# Patient Record
Sex: Female | Born: 1935 | Race: White | Hispanic: No | Smoking: Never smoker
Health system: Southern US, Community
[De-identification: ages and names within clinical notes are randomized; demographics above are authoritative.]

## PROBLEM LIST (undated history)

## (undated) DIAGNOSIS — F0390 Unspecified dementia without behavioral disturbance: Secondary | ICD-10-CM

## (undated) DIAGNOSIS — F329 Major depressive disorder, single episode, unspecified: Secondary | ICD-10-CM

## (undated) DIAGNOSIS — S42309A Unspecified fracture of shaft of humerus, unspecified arm, initial encounter for closed fracture: Secondary | ICD-10-CM

## (undated) DIAGNOSIS — M810 Age-related osteoporosis without current pathological fracture: Secondary | ICD-10-CM

## (undated) DIAGNOSIS — I671 Cerebral aneurysm, nonruptured: Secondary | ICD-10-CM

## (undated) DIAGNOSIS — F32A Depression, unspecified: Secondary | ICD-10-CM

## (undated) DIAGNOSIS — K5900 Constipation, unspecified: Secondary | ICD-10-CM

## (undated) DIAGNOSIS — I10 Essential (primary) hypertension: Secondary | ICD-10-CM

## (undated) DIAGNOSIS — R296 Repeated falls: Secondary | ICD-10-CM

## (undated) DIAGNOSIS — N189 Chronic kidney disease, unspecified: Secondary | ICD-10-CM

## (undated) HISTORY — DX: Depression, unspecified: F32.A

## (undated) HISTORY — DX: Unspecified fracture of shaft of humerus, unspecified arm, initial encounter for closed fracture: S42.309A

## (undated) HISTORY — DX: Major depressive disorder, single episode, unspecified: F32.9

---

## 1998-11-27 DIAGNOSIS — S42309A Unspecified fracture of shaft of humerus, unspecified arm, initial encounter for closed fracture: Secondary | ICD-10-CM

## 1998-11-27 HISTORY — DX: Unspecified fracture of shaft of humerus, unspecified arm, initial encounter for closed fracture: S42.309A

## 1999-02-25 ENCOUNTER — Emergency Department (HOSPITAL_COMMUNITY): Admission: EM | Admit: 1999-02-25 | Discharge: 1999-02-25 | Payer: Self-pay | Admitting: Emergency Medicine

## 1999-06-23 ENCOUNTER — Encounter: Payer: Self-pay | Admitting: Emergency Medicine

## 1999-06-23 ENCOUNTER — Emergency Department (HOSPITAL_COMMUNITY): Admission: EM | Admit: 1999-06-23 | Discharge: 1999-06-23 | Payer: Self-pay | Admitting: Emergency Medicine

## 2003-04-28 ENCOUNTER — Encounter (INDEPENDENT_AMBULATORY_CARE_PROVIDER_SITE_OTHER): Payer: Self-pay | Admitting: *Deleted

## 2003-04-28 LAB — CONVERTED CEMR LAB

## 2003-05-21 ENCOUNTER — Encounter: Admission: RE | Admit: 2003-05-21 | Discharge: 2003-05-21 | Payer: Self-pay | Admitting: Family Medicine

## 2003-05-21 ENCOUNTER — Other Ambulatory Visit: Admission: RE | Admit: 2003-05-21 | Discharge: 2003-05-21 | Payer: Self-pay | Admitting: Family Medicine

## 2003-06-08 ENCOUNTER — Encounter: Payer: Self-pay | Admitting: Family Medicine

## 2003-06-08 ENCOUNTER — Encounter: Admission: RE | Admit: 2003-06-08 | Discharge: 2003-06-08 | Payer: Self-pay | Admitting: Family Medicine

## 2005-08-23 ENCOUNTER — Ambulatory Visit: Payer: Self-pay | Admitting: Family Medicine

## 2005-08-23 ENCOUNTER — Encounter: Admission: RE | Admit: 2005-08-23 | Discharge: 2005-08-23 | Payer: Self-pay | Admitting: Sports Medicine

## 2005-09-20 ENCOUNTER — Ambulatory Visit: Payer: Self-pay | Admitting: Family Medicine

## 2007-01-24 DIAGNOSIS — J309 Allergic rhinitis, unspecified: Secondary | ICD-10-CM | POA: Insufficient documentation

## 2007-01-24 DIAGNOSIS — M810 Age-related osteoporosis without current pathological fracture: Secondary | ICD-10-CM | POA: Insufficient documentation

## 2007-01-24 DIAGNOSIS — M1990 Unspecified osteoarthritis, unspecified site: Secondary | ICD-10-CM

## 2007-01-25 ENCOUNTER — Encounter (INDEPENDENT_AMBULATORY_CARE_PROVIDER_SITE_OTHER): Payer: Self-pay | Admitting: *Deleted

## 2007-08-15 ENCOUNTER — Encounter (INDEPENDENT_AMBULATORY_CARE_PROVIDER_SITE_OTHER): Payer: Self-pay | Admitting: Family Medicine

## 2008-02-23 ENCOUNTER — Emergency Department (HOSPITAL_COMMUNITY): Admission: EM | Admit: 2008-02-23 | Discharge: 2008-02-23 | Payer: Self-pay | Admitting: Emergency Medicine

## 2008-02-28 ENCOUNTER — Ambulatory Visit: Payer: Self-pay | Admitting: Family Medicine

## 2008-04-01 ENCOUNTER — Ambulatory Visit: Payer: Self-pay | Admitting: Family Medicine

## 2008-04-01 DIAGNOSIS — T8040XA Rh incompatibility reaction due to transfusion of blood or blood products, unspecified, initial encounter: Secondary | ICD-10-CM | POA: Insufficient documentation

## 2008-04-01 DIAGNOSIS — I10 Essential (primary) hypertension: Secondary | ICD-10-CM | POA: Insufficient documentation

## 2008-04-08 ENCOUNTER — Encounter (INDEPENDENT_AMBULATORY_CARE_PROVIDER_SITE_OTHER): Payer: Self-pay | Admitting: Family Medicine

## 2008-04-08 ENCOUNTER — Ambulatory Visit: Payer: Self-pay | Admitting: Family Medicine

## 2008-04-08 LAB — CONVERTED CEMR LAB

## 2008-04-09 LAB — CONVERTED CEMR LAB
ALT: 26 units/L (ref 0–35)
AST: 22 units/L (ref 0–37)
Albumin: 4.9 g/dL (ref 3.5–5.2)
Calcium: 9.6 mg/dL (ref 8.4–10.5)
Chloride: 107 meq/L (ref 96–112)
Cholesterol, target level: 200 mg/dL
HDL goal, serum: 40 mg/dL
Platelets: 262 10*3/uL (ref 150–400)
Potassium: 4.7 meq/L (ref 3.5–5.3)
RDW: 12.9 % (ref 11.5–15.5)
Sodium: 143 meq/L (ref 135–145)
TSH: 2.214 microintl units/mL (ref 0.350–5.50)
Total CHOL/HDL Ratio: 4.3
Total Protein: 7.3 g/dL (ref 6.0–8.3)

## 2008-04-23 ENCOUNTER — Encounter: Admission: RE | Admit: 2008-04-23 | Discharge: 2008-04-23 | Payer: Self-pay | Admitting: Family Medicine

## 2008-04-25 ENCOUNTER — Emergency Department (HOSPITAL_COMMUNITY): Admission: EM | Admit: 2008-04-25 | Discharge: 2008-04-25 | Payer: Self-pay | Admitting: Emergency Medicine

## 2008-04-27 ENCOUNTER — Ambulatory Visit: Payer: Self-pay | Admitting: Gastroenterology

## 2008-04-28 ENCOUNTER — Ambulatory Visit: Payer: Self-pay | Admitting: Family Medicine

## 2008-04-28 ENCOUNTER — Encounter (INDEPENDENT_AMBULATORY_CARE_PROVIDER_SITE_OTHER): Payer: Self-pay | Admitting: Family Medicine

## 2008-05-05 ENCOUNTER — Ambulatory Visit: Payer: Self-pay | Admitting: Family Medicine

## 2008-05-11 ENCOUNTER — Ambulatory Visit: Payer: Self-pay | Admitting: Gastroenterology

## 2008-07-28 ENCOUNTER — Ambulatory Visit: Payer: Self-pay | Admitting: Sports Medicine

## 2008-07-28 ENCOUNTER — Telehealth: Payer: Self-pay | Admitting: *Deleted

## 2008-08-17 ENCOUNTER — Ambulatory Visit: Payer: Self-pay | Admitting: Family Medicine

## 2008-08-17 DIAGNOSIS — R413 Other amnesia: Secondary | ICD-10-CM

## 2008-08-24 ENCOUNTER — Encounter: Payer: Self-pay | Admitting: Family Medicine

## 2008-08-25 ENCOUNTER — Encounter: Payer: Self-pay | Admitting: *Deleted

## 2008-10-23 ENCOUNTER — Emergency Department (HOSPITAL_COMMUNITY): Admission: EM | Admit: 2008-10-23 | Discharge: 2008-10-23 | Payer: Self-pay | Admitting: Emergency Medicine

## 2009-01-08 ENCOUNTER — Ambulatory Visit: Payer: Self-pay | Admitting: Family Medicine

## 2009-01-22 ENCOUNTER — Ambulatory Visit: Payer: Self-pay | Admitting: Family Medicine

## 2009-02-15 ENCOUNTER — Ambulatory Visit: Payer: Self-pay | Admitting: Family Medicine

## 2009-04-07 ENCOUNTER — Ambulatory Visit: Payer: Self-pay | Admitting: Family Medicine

## 2009-04-07 ENCOUNTER — Encounter: Payer: Self-pay | Admitting: Family Medicine

## 2009-04-07 LAB — CONVERTED CEMR LAB

## 2009-04-08 ENCOUNTER — Encounter: Payer: Self-pay | Admitting: Family Medicine

## 2009-04-08 ENCOUNTER — Telehealth: Payer: Self-pay | Admitting: Family Medicine

## 2009-04-08 LAB — CONVERTED CEMR LAB
BUN: 39 mg/dL — ABNORMAL HIGH (ref 6–23)
Basophils Absolute: 0.1 10*3/uL (ref 0.0–0.1)
Basophils Relative: 1 % (ref 0–1)
Calcium: 10.3 mg/dL (ref 8.4–10.5)
Creatinine, Ser: 1.12 mg/dL (ref 0.40–1.20)
Glucose, Bld: 89 mg/dL (ref 70–99)
Hemoglobin: 12.1 g/dL (ref 12.0–15.0)
MCHC: 33.7 g/dL (ref 30.0–36.0)
Monocytes Absolute: 0.4 10*3/uL (ref 0.1–1.0)
Neutro Abs: 4.5 10*3/uL (ref 1.7–7.7)
RDW: 13.3 % (ref 11.5–15.5)
Sodium: 140 meq/L (ref 135–145)

## 2009-05-17 ENCOUNTER — Encounter: Payer: Self-pay | Admitting: Family Medicine

## 2009-05-17 ENCOUNTER — Ambulatory Visit: Payer: Self-pay | Admitting: Family Medicine

## 2009-06-16 ENCOUNTER — Ambulatory Visit: Payer: Self-pay | Admitting: Family Medicine

## 2009-07-07 ENCOUNTER — Encounter: Admission: RE | Admit: 2009-07-07 | Discharge: 2009-07-07 | Payer: Self-pay | Admitting: Family Medicine

## 2009-07-07 ENCOUNTER — Encounter: Payer: Self-pay | Admitting: Family Medicine

## 2009-07-08 ENCOUNTER — Ambulatory Visit: Payer: Self-pay | Admitting: Family Medicine

## 2009-07-13 ENCOUNTER — Encounter: Admission: RE | Admit: 2009-07-13 | Discharge: 2009-07-13 | Payer: Self-pay | Admitting: Family Medicine

## 2009-08-18 ENCOUNTER — Ambulatory Visit: Payer: Self-pay | Admitting: Family Medicine

## 2009-08-18 ENCOUNTER — Encounter: Payer: Self-pay | Admitting: *Deleted

## 2009-08-18 DIAGNOSIS — K5909 Other constipation: Secondary | ICD-10-CM | POA: Insufficient documentation

## 2009-08-18 DIAGNOSIS — H547 Unspecified visual loss: Secondary | ICD-10-CM | POA: Insufficient documentation

## 2009-11-03 ENCOUNTER — Encounter: Payer: Self-pay | Admitting: Family Medicine

## 2009-11-03 ENCOUNTER — Telehealth: Payer: Self-pay | Admitting: Family Medicine

## 2009-11-03 ENCOUNTER — Ambulatory Visit: Payer: Self-pay | Admitting: Family Medicine

## 2009-11-24 ENCOUNTER — Encounter: Payer: Self-pay | Admitting: Family Medicine

## 2010-03-04 ENCOUNTER — Encounter: Payer: Self-pay | Admitting: Family Medicine

## 2010-03-04 ENCOUNTER — Ambulatory Visit: Payer: Self-pay | Admitting: Family Medicine

## 2010-03-04 DIAGNOSIS — N183 Chronic kidney disease, stage 3 (moderate): Secondary | ICD-10-CM

## 2010-03-05 ENCOUNTER — Encounter: Payer: Self-pay | Admitting: Family Medicine

## 2010-03-05 LAB — CONVERTED CEMR LAB
Calcium: 9.4 mg/dL (ref 8.4–10.5)
Creatinine, Ser: 0.81 mg/dL (ref 0.40–1.20)
TSH: 4.183 microintl units/mL (ref 0.350–4.500)

## 2010-06-24 ENCOUNTER — Ambulatory Visit: Payer: Self-pay | Admitting: Family Medicine

## 2010-06-24 ENCOUNTER — Encounter: Payer: Self-pay | Admitting: Family Medicine

## 2010-06-24 LAB — CONVERTED CEMR LAB
Potassium: 4.3 meq/L (ref 3.5–5.3)
Sodium: 146 meq/L — ABNORMAL HIGH (ref 135–145)

## 2010-07-19 ENCOUNTER — Ambulatory Visit: Payer: Self-pay | Admitting: Family Medicine

## 2010-08-17 ENCOUNTER — Ambulatory Visit: Payer: Self-pay | Admitting: Family Medicine

## 2010-09-02 ENCOUNTER — Encounter: Payer: Self-pay | Admitting: Family Medicine

## 2010-10-26 ENCOUNTER — Encounter (INDEPENDENT_AMBULATORY_CARE_PROVIDER_SITE_OTHER): Payer: Self-pay | Admitting: *Deleted

## 2010-11-14 ENCOUNTER — Ambulatory Visit: Payer: Self-pay | Admitting: Family Medicine

## 2010-12-14 ENCOUNTER — Ambulatory Visit: Admission: RE | Admit: 2010-12-14 | Discharge: 2010-12-14 | Payer: Self-pay | Source: Home / Self Care

## 2010-12-19 ENCOUNTER — Encounter: Payer: Self-pay | Admitting: Family Medicine

## 2010-12-21 ENCOUNTER — Encounter (INDEPENDENT_AMBULATORY_CARE_PROVIDER_SITE_OTHER): Payer: Self-pay | Admitting: *Deleted

## 2010-12-27 NOTE — Assessment & Plan Note (Signed)
Summary: F/U/KH   Vital Signs:  Patient profile:   77 year old female Height:      59 inches Weight:      129 pounds BMI:     26.15 BSA:     1.53 Temp:     98.7 degrees F Pulse rate:   61 / minute BP sitting:   132 / 62  Vitals Entered By: Jone Baseman CMA (July 19, 2010 1:35 PM) CC: F/U BP Is Patient Diabetic? No Pain Assessment Patient in pain? no        Primary Care Provider:  Ellery Plunk MD  CC:  F/U BP.  History of Present Illness: f/u for BP but got confused and did nto stop her HCTZ.  still having some othrostatic hypotension symptoms.    Habits & Providers  Alcohol-Tobacco-Diet     Tobacco Status: never  Current Medications (verified): 1)  Calcium 600/vitamin D 600-400 Mg-Unit  Chew (Calcium Carbonate-Vitamin D) .Marland Kitchen.. 1 Tab By Mouth Bid 2)  Fluticasone Propionate 50 Mcg/act  Susp (Fluticasone Propionate) .... One Spray Into Each Nostril Daily 3)  Adult Aspirin Low Strength 81 Mg  Tbdp (Aspirin) .Marland Kitchen.. 1 Tab By Mouth Daily 4)  Naproxen 500 Mg Tabs (Naproxen) .Marland Kitchen.. 1 Tab By Mouth Two Times A Day 5)  Zantac 150 Mg Caps (Ranitidine Hcl) .Marland Kitchen.. 1 Cap By Mouth Two Times A Day While Taking Naproxen 6)  Miralax  Powd (Polyethylene Glycol 3350) .Marland Kitchen.. 1 Capful in 8 Oz Water Twice Daily For Constipation.  Disp Qs X1 Month.  Allergies (verified): 1)  ! * Tramadol  Review of Systems  The patient denies anorexia, fever, chest pain, and abdominal pain.    Physical Exam  General:  Well-developed,well-nourished,in no acute distress; alert,appropriate and cooperative throughout examination Lungs:  Normal respiratory effort, chest expands symmetrically. Lungs are clear to auscultation, no crackles or wheezes. Heart:  Normal rate and regular rhythm. S1 and S2 normal without gallop, murmur, click, rub or other extra sounds.   Impression & Recommendations:  Problem # 1:  HYPERTENSION, BENIGN (ICD-401.1) Assessment Unchanged still lower than it needs to be given that  she is symptomatic with standing.  stop HCTZ, rtc in one month for BP check The following medications were removed from the medication list:    Hydrochlorothiazide 25 Mg Tabs (Hydrochlorothiazide) .Marland Kitchen... Take 1/2 tab by mouth every day  Orders: [facility]- Est Level  3 (16109)  Complete Medication List: 1)  Calcium 600/vitamin D 600-400 Mg-unit Chew (Calcium carbonate-vitamin d) .Marland Kitchen.. 1 tab by mouth bid 2)  Fluticasone Propionate 50 Mcg/act Susp (Fluticasone propionate) .... One spray into each nostril daily 3)  Adult Aspirin Low Strength 81 Mg Tbdp (Aspirin) .Marland Kitchen.. 1 tab by mouth daily 4)  Naproxen 500 Mg Tabs (Naproxen) .Marland Kitchen.. 1 tab by mouth two times a day 5)  Zantac 150 Mg Caps (Ranitidine hcl) .Marland Kitchen.. 1 cap by mouth two times a day while taking naproxen 6)  Miralax Powd (Polyethylene glycol 3350) .Marland Kitchen.. 1 capful in 8 oz water twice daily for constipation.  disp qs x1 month.  Patient Instructions: 1)  Please come back in one month 2)  do not take the hydrochlorathiazide.  we will see how your blood pressure is without that medication

## 2010-12-27 NOTE — Assessment & Plan Note (Signed)
Summary: f/u eo   Vital Signs:  Patient profile:   77 year old female Height:      59 inches Weight:      129 pounds BMI:     26.15 Pulse rate:   69 / minute BP sitting:   144 / 55  (left arm) Cuff size:   regular  Vitals Entered By: Jimmy Footman, CMA (June 24, 2010 9:54 AM) CC: constipation, BP, health screening Is Patient Diabetic? No Pain Assessment Patient in pain? no        Primary Care Provider:  Ellery Plunk MD  CC:  constipation, BP, and health screening.  History of Present Illness: constipation- managed well with miralax. using exlax 1x/month.  no bleeding, weight loss, fevers, abd pain.  has one small BM daily.  eating fruits and veggies from frozen.  son helps prepare meals.  BP-  VS look stable today.  pt taking 12.5mg  HCTZ.  has some occasionally dizziness on standing.  continues to have some cramping in feet and legs.  health maintanence- pt still going for mammograms, does not want to get colonoscopy due to cost.  pt reports mother and sister with breast Ca but mother died of DM complications, not breast Ca.  Current Medications (verified): 1)  Calcium 600/vitamin D 600-400 Mg-Unit  Chew (Calcium Carbonate-Vitamin D) .Marland Kitchen.. 1 Tab By Mouth Bid 2)  Fluticasone Propionate 50 Mcg/act  Susp (Fluticasone Propionate) .... One Spray Into Each Nostril Daily 3)  Adult Aspirin Low Strength 81 Mg  Tbdp (Aspirin) .Marland Kitchen.. 1 Tab By Mouth Daily 4)  Naproxen 500 Mg Tabs (Naproxen) .Marland Kitchen.. 1 Tab By Mouth Two Times A Day 5)  Zantac 150 Mg Caps (Ranitidine Hcl) .Marland Kitchen.. 1 Cap By Mouth Two Times A Day While Taking Naproxen 6)  Miralax  Powd (Polyethylene Glycol 3350) .Marland Kitchen.. 1 Capful in 8 Oz Water Twice Daily For Constipation.  Disp Qs X1 Month. 7)  Hydrochlorothiazide 25 Mg Tabs (Hydrochlorothiazide) .... Take 1/2 Tab By Mouth Every Day  Allergies (verified): 1)  ! * Tramadol  Review of Systems  The patient denies anorexia, fever, weight gain, vision loss, hoarseness, chest pain, and  prolonged cough.    Physical Exam  General:  vs reviewed, BP stablealert, well-developed, and well-nourished.   Head:  Normocephalic and atraumatic without obvious abnormalities. No apparent alopecia or balding. Neck:  No deformities, masses, or tenderness noted. Lungs:  Normal respiratory effort, chest expands symmetrically. Lungs are clear to auscultation, no crackles or wheezes. Heart:  Normal rate and regular rhythm. S1 and S2 normal without gallop, murmur, click, rub or other extra sounds. Abdomen:  Bowel sounds positive,abdomen soft and non-tender without masses, organomegaly or hernias noted. Extremities:  No clubbing, cyanosis, edema, or deformity noted with normal full range of motion of all joints.     Impression & Recommendations:  Problem # 1:  HYPERTENSION, BENIGN (ICD-401.1) Assessment Unchanged given pt's age, will allow permissive HTN to 150s/90s to prevent her from having orthostatic spells.  asked her to hold HCTZ for 3-4 weeks and come back to see me.  will eval BP off medication and see if  could allow her to go off med for a while. Her updated medication list for this problem includes:    Hydrochlorothiazide 25 Mg Tabs (Hydrochlorothiazide) .Marland Kitchen... Take 1/2 tab by mouth every day  Orders: [facility]- Est Level  3 (04540)  Problem # 2:  RENAL INSUFFICIENCY (ICD-588.9) Assessment: Unchanged given her chronic naproxen use and hx of renal insufficiency, will  check Cr to make sure not rising on medications.  last Cr 0.8 Orders: Basic Met-[facility] ([id number]) [facility]- Est Level  3 (09811)  Problem # 3:  CONSTIPATION, CHRONIC (ICD-564.09) Assessment: Improved one BM per day.  no signs of red flags that would necessitate colonoscopy.  continue current management. Her updated medication list for this problem includes:    Miralax Powd (Polyethylene glycol 3350) .Marland Kitchen... 1 capful in 8 oz water twice daily for constipation.  disp qs x1 month.  Orders: [facility]- Est Level  3 (91478)  Problem  # 4:  screening studies Assessment: Unchanged discussed mammograms and colonoscopies with patient.  risk vs benefit.  though pt has hx of breast ca, at this age her family hx is unlikely to be strong player for breast ca.  discussed incidence of breast ca increasing with age, but these tumors more likely to be slow growing.  left decision to pt.    Complete Medication List: 1)  Calcium 600/vitamin D 600-400 Mg-unit Chew (Calcium carbonate-vitamin d) .Marland Kitchen.. 1 tab by mouth bid 2)  Fluticasone Propionate 50 Mcg/act Susp (Fluticasone propionate) .... One spray into each nostril daily 3)  Adult Aspirin Low Strength 81 Mg Tbdp (Aspirin) .Marland Kitchen.. 1 tab by mouth daily 4)  Naproxen 500 Mg Tabs (Naproxen) .Marland Kitchen.. 1 tab by mouth two times a day 5)  Zantac 150 Mg Caps (Ranitidine hcl) .Marland Kitchen.. 1 cap by mouth two times a day while taking naproxen 6)  Miralax Powd (Polyethylene glycol 3350) .Marland Kitchen.. 1 capful in 8 oz water twice daily for constipation.  disp qs x1 month. 7)  Hydrochlorothiazide 25 Mg Tabs (Hydrochlorothiazide) .... Take 1/2 tab by mouth every day  Patient Instructions: 1)  It was very nice to meet you today! 2)  You have been taking great care of yourself! 3)  The mammograms and the colonoscopy are up to you.  We have discussed some of the pluses and minuses and you can decide. 4)  For your dizziness, lets try stopping the HCTZ for a few weeks.  Come back and see me in 3-4 weeks.

## 2010-12-27 NOTE — Assessment & Plan Note (Signed)
Summary: f/u,df   Vital Signs:  Patient profile:   77 year old female Height:      59 inches Weight:      134.3 pounds BMI:     27.22 Temp:     97.6 degrees F oral Pulse rate:   69 / minute BP sitting:   140 / 68  (left arm) Cuff size:   regular  Vitals Entered By: Garen Grams LPN (March 04, 5008 1:39 PM)  Serial Vital Signs/Assessments:  Comments: 1:47 PM [place] BP: 140/68 By: Jone Baseman CMA   CC: f/u Is Patient Diabetic? No Pain Assessment Patient in pain? yes     Location: legs/back   Primary Care Provider:  Romero Belling MD  CC:  f/u.  History of Present Illness: 77 yo female here for:  HYPERTENSION, BENIGN (ICD-401.1) Denies dyspnea, chest pain, LE edema.  Taking HCTZ as prescribed.  RENAL INSUFFICIENCY (ICD-588.9) Last Cr 1.12 in 03/2009, indicates GFR 47.  VISUAL ACUITY, DECREASED (ICD-369.9) Has cataracts.  Does not drive, reads a lot with glasses.  CONSTIPATION, CHRONIC (ICD-564.09) Stooling once per week.  Denies hematochezia, dyschezia, melena.  Using Miralax once weekly.  Occasional stimulant laxative use.  Has never had a colonoscopy secondary to concern for cost.  Stool cards for screening have been negative.  OSTEOARTHRITIS, MULTI SITES (ICD-715.98) Stable on Naproxen.  Habits & Providers  Alcohol-Tobacco-Diet     Tobacco Status: never  Current Medications (verified): 1)  Calcium 600/vitamin D 600-400 Mg-Unit  Chew (Calcium Carbonate-Vitamin D) .Marland Kitchen.. 1 Tab By Mouth Bid 2)  Fluticasone Propionate 50 Mcg/act  Susp (Fluticasone Propionate) .... One Spray Into Each Nostril Daily 3)  Adult Aspirin Low Strength 81 Mg  Tbdp (Aspirin) .Marland Kitchen.. 1 Tab By Mouth Daily 4)  Naproxen 500 Mg Tabs (Naproxen) .Marland Kitchen.. 1 Tab By Mouth Two Times A Day 5)  Zantac 150 Mg Caps (Ranitidine Hcl) .Marland Kitchen.. 1 Cap By Mouth Two Times A Day While Taking Naproxen 6)  Miralax  Powd (Polyethylene Glycol 3350) .Marland Kitchen.. 1 Capful in 8 Oz Water Once Daily For Constipation.  Disp Qs X1  Month. 7)  Metamucil Smooth Texture 63 % Powd (Psyllium) .Marland Kitchen.. 1 Teaspoon in 8 Oz Water Two Times A Day For Constipation.  Disp Qs X1 Month. 8)  Hydrochlorothiazide 25 Mg Tabs (Hydrochlorothiazide) .... Take 1/2 Tab By Mouth Every Day  Allergies (verified): 1)  ! * Tramadol  Physical Exam  Additional Exam:  VITALS:  Reviewed, normal GEN: Alert & oriented, no acute distress NECK: Midline trachea, no masses/thyromegaly, no cervical lymphadenopathy CARDIO: Regular rate and rhythm, no murmurs/rubs/gallops, 2+ bilateral radial pulses RESP: Clear to auscultation, normal work of breathing, no retractions/accessory muscle use ABD: Normoactive bowel sounds, nontender, no masses/hepatosplenomegaly EXT: Nontender, no edema    Impression & Recommendations:  Problem # 1:  RENAL INSUFFICIENCY (ICD-588.9) Assessment Unchanged Would like to avoid NSAIDs but this is all that has given her relief for OA.  Recheck Cr today. Orders: Basic Met-[facility] ([id number]) [facility]- Est  Level 4 (69678)  Problem # 2:  HYPERTENSION, BENIGN (ICD-401.1) Assessment: Improved  Her updated medication list for this problem includes:    Hydrochlorothiazide 25 Mg Tabs (Hydrochlorothiazide) .Marland Kitchen... Take 1/2 tab by mouth every day  BP today: 140/68 Prior BP: 135/67 (11/03/2009)  Prior 10 Yr Risk Heart Disease: 17 % (04/09/2008)  Labs Reviewed: K+: 5.0 (04/07/2009) Creat: : 1.12 (04/07/2009)   Chol: 208 (04/08/2008)   HDL: 48 (04/08/2008)   LDL: 132 (04/08/2008)   TG: 141 (  04/08/2008)  Orders: [facility]- Est  Level 4 (98119)  Problem # 3:  CONSTIPATION, CHRONIC (ICD-564.09) Assessment: Deteriorated  Increase Miralax to two times a day.  Encourage patient to look into cost of screening colonoscopy. Her updated medication list for this problem includes:    Miralax Powd (Polyethylene glycol 3350) .Marland Kitchen... 1 capful in 8 oz water once daily for constipation.  disp qs x1 month.  Orders: [facility]- Est  Level 4 (99214)  Problem # 4:   OSTEOARTHRITIS, MULTI SITES (ICD-715.98) Assessment: Improved Leg and back pain are stable on Naproxen.  APAP provided no relief for her in the past.  Would like to avoid NSAIDs given renal insufficiency. Her updated medication list for this problem includes:    Adult Aspirin Low Strength 81 Mg Tbdp (Aspirin) .Marland Kitchen... 1 tab by mouth daily    Naproxen 500 Mg Tabs (Naproxen) .Marland Kitchen... 1 tab by mouth two times a day  Complete Medication List: 1)  Calcium 600/vitamin D 600-400 Mg-unit Chew (Calcium carbonate-vitamin d) .Marland Kitchen.. 1 tab by mouth bid 2)  Fluticasone Propionate 50 Mcg/act Susp (Fluticasone propionate) .... One spray into each nostril daily 3)  Adult Aspirin Low Strength 81 Mg Tbdp (Aspirin) .Marland Kitchen.. 1 tab by mouth daily 4)  Naproxen 500 Mg Tabs (Naproxen) .Marland Kitchen.. 1 tab by mouth two times a day 5)  Zantac 150 Mg Caps (Ranitidine hcl) .Marland Kitchen.. 1 cap by mouth two times a day while taking naproxen 6)  Miralax Powd (Polyethylene glycol 3350) .Marland Kitchen.. 1 capful in 8 oz water twice daily for constipation.  disp qs x1 month. 7)  Hydrochlorothiazide 25 Mg Tabs (Hydrochlorothiazide) .... Take 1/2 tab by mouth every day  Patient Instructions: 1)  I think it is important to look into a colonoscopy for you.  Please call your insurance company and ask them how much a screening colonoscopy would cost. 2)  In the meantime, start using Miralax two times a day. 3)  Please schedule a follow-up appointment in 3 months--please come sooner if you do not stool at least once every 3 days.  Prescriptions: MIRALAX  POWD (POLYETHYLENE GLYCOL 3350) 1 capful in 8 oz water twice daily for constipation.  Disp QS x1 month.  #1 x 3   Entered and Authorized by:   Romero Belling MD   Signed by:   Romero Belling MD on 03/04/2010   Method used:   Electronically to        [id number] (retail)       [address]       [place], [place]  [id number]       Ph: [phone]       Fax: [phone]   RxID:    [id number]   Appended Document: f/u,df    Clinical Lists Changes  Orders: Added new Test order of [facility] [id number]) - Signed

## 2010-12-27 NOTE — Assessment & Plan Note (Signed)
Summary: F/U/KH   Vital Signs:  Patient profile:   77 year old female Height:      59 inches Weight:      133.2 pounds BMI:     27.00 Temp:     97.9 degrees F oral Pulse rate:   69 / minute BP sitting:   166 / 70  (left arm) Cuff size:   regular  Vitals Entered By: Garen Grams LPN (August 17, 2010 9:57 AM) CC: f/u bp; meds Is Patient Diabetic? No Pain Assessment Patient in pain? no        Primary Care Provider:  Ellery Plunk MD  CC:  f/u bp; meds.  History of Present Illness: Stopped HCTZ.  thinks she is doing better with less dizziness.  very occasional orthostatic hypotension now.  "getting along better" still active in house work.  helped by her son.    Habits & Providers  Alcohol-Tobacco-Diet     Tobacco Status: never  Current Medications (verified): 1)  Calcium 600/vitamin D 600-400 Mg-Unit  Chew (Calcium Carbonate-Vitamin D) .Marland Kitchen.. 1 Tab By Mouth Bid 2)  Fluticasone Propionate 50 Mcg/act  Susp (Fluticasone Propionate) .... One Spray Into Each Nostril Daily 3)  Adult Aspirin Low Strength 81 Mg  Tbdp (Aspirin) .Marland Kitchen.. 1 Tab By Mouth Daily 4)  Naproxen 500 Mg Tabs (Naproxen) .Marland Kitchen.. 1 Tab By Mouth Two Times A Day 5)  Zantac 150 Mg Caps (Ranitidine Hcl) .Marland Kitchen.. 1 Cap By Mouth Two Times A Day While Taking Naproxen 6)  Miralax  Powd (Polyethylene Glycol 3350) .Marland Kitchen.. 1 Capful in 8 Oz Water Twice Daily For Constipation.  Disp Qs X1 Month.  Allergies (verified): 1)  ! * Tramadol  Review of Systems  The patient denies anorexia, fever, and weight loss.    Physical Exam  General:  VS reviewed.  alert, well-developed, well-nourished, and well-hydrated.   Head:  normocephalic and atraumatic.   Lungs:  Normal respiratory effort, chest expands symmetrically. Lungs are clear to auscultation, no crackles or wheezes. Heart:  Normal rate and regular rhythm. S1 and S2 normal without gallop, murmur, click, rub or other extra sounds. Abdomen:  Bowel sounds positive,abdomen soft and  non-tender without masses, organomegaly or hernias noted. Extremities:  trace edema BLE   Impression & Recommendations:  Problem # 1:  HYPERTENSION, BENIGN (ICD-401.1) Assessment Unchanged BP is up but need to balance that with her dizzy spells.  discussed with pt and we decided that she was doing better off of the medication.  allow some permissive HTN to avoid hypotension.  will check Cr at next visit.  RTC 3 months. Orders: [facility]- Est Level  3 ([address]109)  Complete Medication List: 1)  Calcium 600/vitamin D 600-400 Mg-unit Chew (Calcium carbonate-vitamin d) .Marland Kitchen.. 1 tab by mouth bid 2)  Fluticasone Propionate 50 Mcg/act Susp (Fluticasone propionate) .... One spray into each nostril daily 3)  Adult Aspirin Low Strength 81 Mg Tbdp (Aspirin) .Marland Kitchen.. 1 tab by mouth daily 4)  Naproxen 500 Mg Tabs (Naproxen) .Marland Kitchen.. 1 tab by mouth two times a day 5)  Zantac 150 Mg Caps (Ranitidine hcl) .Marland Kitchen.. 1 cap by mouth two times a day while taking naproxen 6)  Miralax Powd (Polyethylene glycol 3350) .Marland Kitchen.. 1 capful in 8 oz water twice daily for constipation.  disp qs x1 month.  Patient Instructions: 1)  COme back to see me in 3 months or earlier if you need.  2)  Get up from chairs slowly so that you don't fall

## 2010-12-27 NOTE — Letter (Signed)
Summary: Results Follow-up Letter  [facility]  [address]   [place], [place] [address]   Phone: [phone]  Fax: [phone]    03/05/2010  [address] [place], [place]  [address]  Dear [facility],   The following are the results of your recent test(s):  Kidney Function -- normal Blood Sugar -- normal Blood Electrolytes -- normal Thyroid Function -- normal  Sincerely, Madlyn Frankel. Constance Goltz, MD  Appended Document: Results Follow-up Letter mailed.

## 2010-12-27 NOTE — Miscellaneous (Signed)
Summary: medical record req  Clinical Lists Changes  Rec'd medical record request to go to [employer] picked up 07/22/10 [facility]  October 26, 2010 1:39 PM

## 2010-12-27 NOTE — Miscellaneous (Signed)
  Clinical Lists Changes  Problems: Changed problem from RENAL INSUFFICIENCY (ICD-588.9) to CHRONIC KIDNEY DISEASE STAGE III (MODERATE) (ICD-585.3) 

## 2010-12-29 NOTE — Assessment & Plan Note (Signed)
Summary: 3 mo f/u,df   Vital Signs:  Patient profile:   77 year old female Height:      59 inches Weight:      131.5 pounds BMI:     26.66 Temp:     98.1 degrees F oral Pulse rate:   77 / minute BP sitting:   178 / 76  (left arm) Cuff size:   regular  Vitals Entered By: Garen Grams LPN (November 14, 2010 9:43 AM) CC: f/u HTN, back pain Is Patient Diabetic? No Pain Assessment Patient in pain? no        Primary Care Yisel Megill:  Ellery Plunk MD  CC:  f/u HTN and back pain.  History of Present Illness: HTN- does not take BP at home.  previously had some dizzy spells associated with orthostatic hypotension.  these are less frequent now.  back pain- stiff in the AM.  thinks that she sleeps well on a good mattress.  does a lot of housework including vaccuuming. takes aleve for the soreness, usually feels better in one hour.  Habits & Providers  Alcohol-Tobacco-Diet     Tobacco Status: never  Current Medications (verified): 1)  Calcium 600/vitamin D 600-400 Mg-Unit  Chew (Calcium Carbonate-Vitamin D) .Marland Kitchen.. 1 Tab By Mouth Bid 2)  Fluticasone Propionate 50 Mcg/act  Susp (Fluticasone Propionate) .... One Spray Into Each Nostril Daily 3)  Adult Aspirin Low Strength 81 Mg  Tbdp (Aspirin) .Marland Kitchen.. 1 Tab By Mouth Daily 4)  Naproxen 500 Mg Tabs (Naproxen) .Marland Kitchen.. 1 Tab By Mouth Two Times A Day 5)  Zantac 150 Mg Caps (Ranitidine Hcl) .Marland Kitchen.. 1 Cap By Mouth Two Times A Day While Taking Naproxen 6)  Miralax  Powd (Polyethylene Glycol 3350) .Marland Kitchen.. 1 Capful in 8 Oz Water Twice Daily For Constipation.  Disp Qs X1 Month.  Allergies (verified): 1)  ! * Tramadol  Review of Systems  The patient denies anorexia, fever, weight loss, and dyspnea on exertion.    Physical Exam  General:  VSr echecked BP 160/78well-developed, well-nourished, and well-hydrated.   Head:  normocephalic and atraumatic.   Lungs:  Normal respiratory effort, chest expands symmetrically. Lungs are clear to auscultation, no  crackles or wheezes. Heart:  Normal rate and regular rhythm. S1 and S2 normal without gallop, murmur, click, rub or other extra sounds. Msk:  weakness in hip flexors on standing but can stand without using arms Neurologic:  alert & oriented X3 and cranial nerves II-XII intact.     Impression & Recommendations:  Problem # 1:  HYPERTENSION, BENIGN (ICD-401.1) Assessment Unchanged will allow greater freedom with BP given symptomatic Hyptenstion with standing.  continue to monitor. Orders: [facility]- Est Level  3 ([address])  Problem # 2:  OSTEOARTHRITIS, MULTI SITES (ICD-715.98) Assessment: Unchanged refill aleve, monitor for worsening.  pt did not think she wanted PT but consider in futre for strength and mobility Her updated medication list for this problem includes:    Adult Aspirin Low Strength 81 Mg Tbdp (Aspirin) .Marland Kitchen... 1 tab by mouth daily    Naproxen 500 Mg Tabs (Naproxen) .Marland Kitchen... 1 tab by mouth two times a day  Orders: [facility]- Est Level  3 ([address])  Complete Medication List: 1)  Calcium 600/vitamin D 600-400 Mg-unit Chew (Calcium carbonate-vitamin d) .Marland Kitchen.. 1 tab by mouth bid 2)  Fluticasone Propionate 50 Mcg/act Susp (Fluticasone propionate) .... One spray into each nostril daily 3)  Adult Aspirin Low Strength 81 Mg Tbdp (Aspirin) .Marland Kitchen.. 1 tab by mouth daily 4)  Naproxen  500 Mg Tabs (Naproxen) .Marland Kitchen.. 1 tab by mouth two times a day 5)  Zantac 150 Mg Caps (Ranitidine hcl) .Marland Kitchen.. 1 cap by mouth two times a day while taking naproxen 6)  Miralax Powd (Polyethylene glycol 3350) .Marland Kitchen.. 1 capful in 8 oz water twice daily for constipation.  disp qs x1 month.  Patient Instructions: 1)  Please come back mid-January to see how you are doing 2)  For your back- take the aleve twice a day. 3)  For your blood pressure, we will continue to monitor. Prescriptions: FLUTICASONE PROPIONATE 50 MCG/ACT  SUSP (FLUTICASONE PROPIONATE) One spray into each nostril daily  #1 x 11   Entered and Authorized by:   Ellery Plunk  MD   Signed by:   Ellery Plunk MD on 11/14/2010   Method used:   Electronically to        [facility] (retail)       [address]       [place], [place]  [id number]       Ph: [phone]       Fax: [phone]   RxID:   [id number] MIRALAX  POWD (POLYETHYLENE GLYCOL 3350) 1 capful in 8 oz water twice daily for constipation.  Disp QS x1 month.  #1 x 6   Entered and Authorized by:   Ellery Plunk MD   Signed by:   Ellery Plunk MD on 11/14/2010   Method used:   Electronically to        [facility] (retail)       [address]       [place], [place]  [id number]       Ph: [phone]       Fax: [phone]   RxID:   [id number] ZANTAC 150 MG CAPS (RANITIDINE HCL) 1 cap by mouth two times a day while taking naproxen  #60 Tablet x 5   Entered and Authorized by:   Ellery Plunk MD   Signed by:   Ellery Plunk MD on 11/14/2010   Method used:   Electronically to        [facility] (retail)       [address]       [place], [place]  [id number]       Ph: [phone]       Fax: [phone]   RxID:   [id number] NAPROXEN 500 MG TABS (NAPROXEN) 1 tab by mouth two times a day  #60 Tablet x 3   Entered and Authorized by:   Ellery Plunk MD   Signed by:   Ellery Plunk MD on 11/14/2010   Method used:   Electronically to        [facility] (retail)       [address]       [place], [place]  [id number]       Ph: [phone]       Fax: [phone]   RxID:   [id number]    Orders Added: 1)  [facility]- Est Level  3 [[address]]

## 2010-12-29 NOTE — Letter (Signed)
Summary: Generic Letter  [facility]  [address]   [place], [place] [address]   Phone: [phone]  Fax: [phone]    12/21/2010  [address] [place], [place]  [address]  Dear Ms. [facility],  We are happy to let you know that since you are covered under Medicare you are able to have a FREE visit at the [facility] to discuss your HEALTH. This is a new benefit for Medicare.  There will be no co-payment.  At this visit you will meet with Arlys John an expert in wellness and the health coach at our clinic.  At this visit we will discuss ways to keep you healthy and feeling well.  This visit will not replace your regular doctor visit and we cannot refill medications.     You will need to plan to be here at least one hour to talk about your medical history, your current status, review all of your medications, and discuss your future plans for your health.  This information will be entered into your record for your doctor to have and review.  If you are interested in staying healthy, this type of visit can help.  Please call the office at: [phone], to schedule a "Medicare Wellness Visit".  The day of the visit you should bring in all of your medications, including any vitamins, herbs, over the counter products you take.  Make a list of all the other doctors that you see, so we know who they are. If you have any other health documents please bring them.  We look forward to helping you stay healthy.  Sincerely,   [facility]  i[facility]

## 2010-12-29 NOTE — Assessment & Plan Note (Signed)
Summary: F/U  KH   Vital Signs:  Patient profile:   77 year old female Height:      59 inches Weight:      131 pounds BMI:     26.55 BSA:     1.54 Temp:     98.1 degrees F Pulse rate:   67 / minute BP sitting:   180 / 80  Vitals Entered By: Jone Baseman CMA (December 14, 2010 9:50 AM) CC: BP, arthritis, memory Is Patient Diabetic? No Pain Assessment Patient in pain? no        Primary Care Provider:  Ellery Plunk MD  CC:  BP, arthritis, and memory.  History of Present Illness: HTN- does not check at home.  has not had many dizzy spells, only when she gets up from a squatting position too fast.  arthritis- feeling better with less aching.  using aleve for pain relief as needed and has not needed it much  memory- admits to forgetting numbers or names.  independant in her ADLs and IADLs.  does not drive now x several years.  SOn helps her when she needs.  He lives with her.   Habits & Providers  Alcohol-Tobacco-Diet     Tobacco Status: never   Geriatric Assessment:  Activities of Daily Living:    Bathing-independent    Dressing-independent    Eating-independent    Toileting-independent    Transferring-independent    Continence-independent Overall Assessment: independent  Instrumental Activities of Daily Living:    Transportation-assisted    Meal/Food Preparation-independent    Shopping Errands-assisted    Housekeeping/Chores-independent    Money Management/Finances-independent    Medication Management-independent    Ability to Use Telephone-independent    Laundry-independent Overall Assessment: independent  Mental Status Exam: (value/max value)    Orientation to Time: 5/5    Orientation to Place: 5/5    Registration: 3/3    Attention/Calculation: 4/5    Recall: 0/3    Language-name 2 objects: 2/2    Language-repeat: 1/1    Language-follow 3-step command: 3/3    Language-read and follow direction: 1/1    Write a sentence: 1/1    Copy design:  0/1 MSE Total score: 25/30  Current Medications (verified): 1)  Calcium 600/vitamin D 600-400 Mg-Unit  Chew (Calcium Carbonate-Vitamin D) .Marland Kitchen.. 1 Tab By Mouth Bid 2)  Fluticasone Propionate 50 Mcg/act  Susp (Fluticasone Propionate) .... One Spray Into Each Nostril Daily 3)  Adult Aspirin Low Strength 81 Mg  Tbdp (Aspirin) .Marland Kitchen.. 1 Tab By Mouth Daily 4)  Naproxen 500 Mg Tabs (Naproxen) .Marland Kitchen.. 1 Tab By Mouth Two Times A Day 5)  Zantac 150 Mg Caps (Ranitidine Hcl) .Marland Kitchen.. 1 Cap By Mouth Two Times A Day While Taking Naproxen 6)  Miralax  Powd (Polyethylene Glycol 3350) .Marland Kitchen.. 1 Capful in 8 Oz Water Twice Daily For Constipation.  Disp Qs X1 Month.  Allergies (verified): 1)  ! * Tramadol  Review of Systems  The patient denies weight loss, hoarseness, and syncope.    Physical Exam  General:  Well-developed,well-nourished,in no acute distress; alert,appropriate and cooperative throughout examination Lungs:  Normal respiratory effort, chest expands symmetrically. Lungs are clear to auscultation, no crackles or wheezes. Heart:  Normal rate and regular rhythm. S1 and S2 normal without gallop, murmur, click, rub or other extra sounds. Abdomen:  Bowel sounds positive,abdomen soft and non-tender without masses, organomegaly or hernias noted. Neurologic:  some intention tremor evident with writing alert & oriented X3, cranial nerves II-XII intact, strength normal  in all extremities, sensation intact to light touch, and gait normal.     Impression & Recommendations:  Problem # 1:  HYPERTENSION, BENIGN (ICD-401.1) Assessment Deteriorated still high today.  decided to start 12.5 of HCTZ, follow up in 2 weeks to check for symptomatic hypotension.  do blood work then Orders: [facility]- Est  Level 4 ([address])  Problem # 2:  OSTEOARTHRITIS, MULTI SITES (ICD-715.98) Assessment: Improved using less aleve now.  will recheck cr at next visit Her updated medication list for this problem includes:    Adult Aspirin Low  Strength 81 Mg Tbdp (Aspirin) .Marland Kitchen... 1 tab by mouth daily    Naproxen 500 Mg Tabs (Naproxen) .Marland Kitchen... 1 tab by mouth two times a day  Orders: [facility]- Est  Level 4 ([address])  Problem # 3:  MEMORY LOSS (ICD-780.93) Assessment: Deteriorated MMSE 25 today.  last check in 2009 was 30.  consider starting aricept at next visit.  still highly functional. Orders: [facility]- Est  Level 4 ([address])  Complete Medication List: 1)  Calcium 600/vitamin D 600-400 Mg-unit Chew (Calcium carbonate-vitamin d) .Marland Kitchen.. 1 tab by mouth bid 2)  Fluticasone Propionate 50 Mcg/act Susp (Fluticasone propionate) .... One spray into each nostril daily 3)  Adult Aspirin Low Strength 81 Mg Tbdp (Aspirin) .Marland Kitchen.. 1 tab by mouth daily 4)  Naproxen 500 Mg Tabs (Naproxen) .Marland Kitchen.. 1 tab by mouth two times a day 5)  Zantac 150 Mg Caps (Ranitidine hcl) .Marland Kitchen.. 1 cap by mouth two times a day while taking naproxen 6)  Miralax Powd (Polyethylene glycol 3350) .Marland Kitchen.. 1 capful in 8 oz water twice daily for constipation.  disp qs x1 month.  Patient Instructions: 1)  start taking 12.5 mg of HCTZ (1/2 of a 25 tablet) 2)  IF you dont have that at home, call me and I will send it to the pharmacy 3)  come back in 2 weeks for a check up and blood work.  4)  If you start feeling bad, call me sooner   Orders Added: 1)  [facility]- Est  Level 4 [[address]]

## 2011-01-25 ENCOUNTER — Ambulatory Visit (INDEPENDENT_AMBULATORY_CARE_PROVIDER_SITE_OTHER): Payer: PRIVATE HEALTH INSURANCE | Admitting: Family Medicine

## 2011-01-25 ENCOUNTER — Encounter: Payer: Self-pay | Admitting: Family Medicine

## 2011-01-25 DIAGNOSIS — M1990 Unspecified osteoarthritis, unspecified site: Secondary | ICD-10-CM

## 2011-01-25 DIAGNOSIS — I10 Essential (primary) hypertension: Secondary | ICD-10-CM

## 2011-01-25 DIAGNOSIS — J069 Acute upper respiratory infection, unspecified: Secondary | ICD-10-CM

## 2011-01-25 MED ORDER — POLYETHYLENE GLYCOL 3350 17 GM/SCOOP PO POWD: 17.0000 g | Freq: Two times a day (BID) | ORAL | Status: DC

## 2011-01-25 MED ORDER — NAPROXEN 500 MG PO TABS: 500.0000 mg | ORAL_TABLET | Freq: Two times a day (BID) | ORAL | Status: DC

## 2011-01-25 NOTE — Assessment & Plan Note (Signed)
Overall improving.  No fevers.  No signs of serious infection.  Supportive care.

## 2011-01-25 NOTE — Assessment & Plan Note (Signed)
Continue Naproxen prn

## 2011-01-25 NOTE — Assessment & Plan Note (Signed)
Not at goal.  Advised patient to return in 3-4 weeks for a blood pressure recheck.  If still elevated would consider starting Norvasc.

## 2011-01-25 NOTE — Progress Notes (Signed)
  Subjective:    Patient ID: Nancy Park, female    DOB: [birth year], 77 y.o.   MRN: [id number]  URI  This is a new problem. The current episode started 1 to 4 weeks ago (over the past week it has been getting a lot better). The problem has been gradually improving. There has been no fever. Associated symptoms include congestion and coughing. Pertinent negatives include no abdominal pain, chest pain, diarrhea, headaches, nausea, swollen glands or vomiting. She has tried nothing for the symptoms.  Hypertension This is a chronic problem. The problem has been gradually worsening since onset. Pertinent negatives include no chest pain, headaches, palpitations or shortness of breath. Past treatments include nothing. There are no compliance problems.  There is no history of angina.      Review of Systems  HENT: Positive for congestion.   Respiratory: Positive for cough. Negative for shortness of breath.   Cardiovascular: Negative for chest pain and palpitations.  Gastrointestinal: Negative for nausea, vomiting, abdominal pain and diarrhea.  Neurological: Negative for headaches.       Objective:   Physical Exam  Constitutional: She appears well-developed and well-nourished.  Eyes: Conjunctivae are normal. Pupils are equal, round, and reactive to light.  Neck: Normal range of motion. Neck supple.  Cardiovascular: Normal rate and regular rhythm.   Murmur heard. Pulmonary/Chest: Effort normal and breath sounds normal. No respiratory distress. She has no wheezes. She has no rales.  Abdominal: Soft. She exhibits no distension.  Skin: Skin is warm and dry.          Assessment & Plan:

## 2011-02-20 ENCOUNTER — Encounter: Payer: Self-pay | Admitting: Family Medicine

## 2011-02-20 ENCOUNTER — Ambulatory Visit (INDEPENDENT_AMBULATORY_CARE_PROVIDER_SITE_OTHER): Payer: PRIVATE HEALTH INSURANCE | Admitting: Family Medicine

## 2011-02-20 VITALS — BP 179/62 | HR 64 | Temp 97.9°F | Wt 130.8 lb

## 2011-02-20 DIAGNOSIS — J309 Allergic rhinitis, unspecified: Secondary | ICD-10-CM

## 2011-02-20 DIAGNOSIS — R011 Cardiac murmur, unspecified: Secondary | ICD-10-CM | POA: Insufficient documentation

## 2011-02-20 DIAGNOSIS — K5909 Other constipation: Secondary | ICD-10-CM

## 2011-02-20 DIAGNOSIS — I10 Essential (primary) hypertension: Secondary | ICD-10-CM

## 2011-02-20 DIAGNOSIS — M1990 Unspecified osteoarthritis, unspecified site: Secondary | ICD-10-CM

## 2011-02-20 MED ORDER — LISINOPRIL 5 MG PO TABS: 5.0000 mg | ORAL_TABLET | Freq: Every day | ORAL | Status: DC

## 2011-02-20 MED ORDER — POLYETHYLENE GLYCOL 3350 17 GM/SCOOP PO POWD: 17.0000 g | Freq: Four times a day (QID) | ORAL | Status: DC

## 2011-02-20 NOTE — Patient Instructions (Signed)
It was nice to see you today Please STOP taking Iron Go get your heart study done START taking lisinopril for blood pressure Come back in 2 weeks for a nurse visit and more blood work See me in one month

## 2011-02-20 NOTE — Assessment & Plan Note (Addendum)
Murmur heard today at left lower sternal border.  Will send for echo.

## 2011-02-20 NOTE — Assessment & Plan Note (Addendum)
Check Cr today, start lisinopril at very small dose (renal dose) and recheck in 2 weeks.  Pt has been taking naproxen.  If Cr is increased, will stop that med. Lab Results  Component Value Date   CREATININE 0.98 06/24/2010

## 2011-02-20 NOTE — Progress Notes (Signed)
Addended by: Ellery Plunk on: 02/20/2011 12:01 PM   Modules accepted: Orders

## 2011-02-20 NOTE — Assessment & Plan Note (Signed)
With some clear drainage today.  Pt continues with flonase, did not want to start claritin today.  Will follow

## 2011-02-20 NOTE — Progress Notes (Signed)
  Subjective:    Patient ID: Nancy Park, female    DOB: [birth year], 77 y.o.   MRN: [id number]  HPI Pt here for BP follow up.  No HA, CP.  Has occasional dizziness but this was worse when on HCTZ.    Constipation- still with hard stools, out of miralax. Started iron b/c she thought it might help her.   Nonsmoker  Review of Systems    denies CP, palpitations, blood in stool Objective:   Physical Exam    Vital signs reviewed General appearance - alert, well appearing, and in no distress and oriented to person, place, and time Heart - normal rate, regular rhythm, systolic murmur heard best at left lower sternal border, vibratory Chest - clear to auscultation, no wheezes, rales or rhonchi, symmetric air entry, no tachypnea, retractions or cyanosis     Assessment & Plan:

## 2011-02-20 NOTE — Assessment & Plan Note (Addendum)
Add 5mg  lisinopril today.  See back in 2 weeks.  BMET and CBC today, BMET in 2 weeks.  Caution with meds since having orthostativ hypotension with previous treatments

## 2011-02-20 NOTE — Assessment & Plan Note (Signed)
Using miralax but bottle too small and ran out.  Stools still hard.  Will resend with bigger bottle

## 2011-02-20 NOTE — Assessment & Plan Note (Signed)
Naproxen currently.  Tramadol did not work with pt (too drowsy).  May have to limit to tylenol if Cr elevated.

## 2011-02-21 LAB — BASIC METABOLIC PANEL
CO2: 26 mEq/L (ref 19–32)
Glucose, Bld: 98 mg/dL (ref 70–99)
Potassium: 4.2 mEq/L (ref 3.5–5.3)
Sodium: 142 mEq/L (ref 135–145)

## 2011-02-23 ENCOUNTER — Ambulatory Visit (HOSPITAL_COMMUNITY)
Admission: RE | Admit: 2011-02-23 | Discharge: 2011-02-23 | Disposition: A | Discharge status: Home / Self Care | Payer: PRIVATE HEALTH INSURANCE | Source: Ambulatory Visit | Attending: Family Medicine | Admitting: Family Medicine

## 2011-02-23 ENCOUNTER — Other Ambulatory Visit: Payer: Self-pay | Admitting: Family Medicine

## 2011-02-23 DIAGNOSIS — I10 Essential (primary) hypertension: Secondary | ICD-10-CM | POA: Insufficient documentation

## 2011-02-23 DIAGNOSIS — I359 Nonrheumatic aortic valve disorder, unspecified: Secondary | ICD-10-CM | POA: Insufficient documentation

## 2011-02-23 DIAGNOSIS — R011 Cardiac murmur, unspecified: Secondary | ICD-10-CM | POA: Insufficient documentation

## 2011-03-07 ENCOUNTER — Other Ambulatory Visit: Payer: Medicaid Other

## 2011-03-07 ENCOUNTER — Ambulatory Visit (INDEPENDENT_AMBULATORY_CARE_PROVIDER_SITE_OTHER): Payer: Medicaid Other | Admitting: Home Health Services

## 2011-03-07 ENCOUNTER — Encounter: Payer: Self-pay | Admitting: Home Health Services

## 2011-03-07 VITALS — BP 163/72 | HR 60 | Temp 98.4°F | Ht 60.0 in | Wt 130.0 lb

## 2011-03-07 DIAGNOSIS — I10 Essential (primary) hypertension: Secondary | ICD-10-CM

## 2011-03-07 DIAGNOSIS — Z0000 Encounter for general adult medical examination without abnormal findings: Secondary | ICD-10-CM

## 2011-03-07 LAB — CBC
HCT: 37.5 % (ref 36.0–46.0)
MCH: 31.4 pg (ref 26.0–34.0)
MCHC: 31.7 g/dL (ref 30.0–36.0)
RDW: 13.4 % (ref 11.5–15.5)

## 2011-03-07 LAB — BASIC METABOLIC PANEL
CO2: 24 mEq/L (ref 19–32)
Calcium: 9.7 mg/dL (ref 8.4–10.5)
Chloride: 108 mEq/L (ref 96–112)
Creat: 0.83 mg/dL (ref 0.40–1.20)
Glucose, Bld: 91 mg/dL (ref 70–99)

## 2011-03-07 NOTE — Progress Notes (Signed)
Patient here for annual wellness visit, patient reports: Risk Factors/Conditions needing evaluation or treatment: Patient does demonstrate some cognitive impairment.  Was unable to recall information and/or make decisive decisions.  Home Safety: Patient lives with son in 1 story home.  Patient reports having smoke detectors and does not have adaptive equipment in the bathroom.  Other Information: Corrective lens: Patient wears corrective lens daily.  Patient reports seeing eye doctor every 2 years. Dentures: Patient has upper dentures, no teeth on bottom.  Is not interested in getting full set of dentures. Memory: Patient reports some memory problems.   Balance max value patientvalue  Sitting balance 1 1  Arise 2 1  Attempts to arise 2 2  Immediate standing balance 2 1  Standing balance 1 1  Nudge 2 2  Eyes closed 1 1  360 degree turn 1 1  Sitting down 2 1   Gait max value patient value  Initiation of gait 1 1  Step length-left 1 1  Step length-right 1 1  Step height-left 1 1  Step height-right 1 1  Step symmetry 1 1  Step continuity 1 1  Path 2 2  Trunk 2 1  Walking stance 1 1   Balance/Gait Score: 22/26    Annual Wellness Visit Requirements Recorded Today In  Medical, family, social history Past Medical, Family, Social History Section  Current providers Care team  Current medications Medications  Wt, BP, Ht, BMI Vital signs  Hearing assessment (welcome visit) Hearing/Vision  Tobacco, alcohol, illicit drug use History  ADL Nurse Assessment  Depression Screening Nurse Assessment  Cognitive impairment/Mini Mental Status Nurse Assessment/ Flowsheet  Fall Risk Nurse Assessment  Home Safety Progress Note  End of Life Planning (welcome visit) Social Documentation  Medicare preventative services Progress Note  Risk factors/conditions needing evaluation/treatment Progress Note  Personalized health advice Patient Instructions, goals, letter  Diet & Exercise Social  Documentation  Emergency Contact Social Documentation  Seat Belts Social Documentation  Sun exposure/protection Social Documentation    Prevention Plan: Reccommended patient contact pharmacy for shingles vaccine.   Recommended Medicare Prevention Screenings Women over 42 Test For Frequency Date of Last- BOLD if needed  Breast Cancer 1-2 yrs 8/10  Cervical Cancer 1-3 yrs Not indicated  Colorectal Cancer 1-10 yrs declined  Osteoporosis once 6/09  Cholesterol 5 yrs 5/09  Diabetes yearly Non-diabettic  HIV yearly declined  Influenza Shot yearly declined  Pneumonia Shot once 6/04  Zostavax Shot once recommended

## 2011-03-07 NOTE — Patient Instructions (Signed)
1. Try to eat 3-4 vegetables a day. 2. Increase physical activity around the house. 3. Contact pharmacy for shingles vaccine. 4. Continue to do word searches and reading. 5. Review your medical wishes with your daughter.

## 2011-03-07 NOTE — Progress Notes (Signed)
BMP AND CBC DONE TODAY Ariyah Sedlack 

## 2011-03-17 ENCOUNTER — Encounter: Payer: Self-pay | Admitting: Home Health Services

## 2011-03-17 NOTE — Progress Notes (Signed)
I have reviewed this visit and discussed with Suzanne Lineberry and agree with her documentation  

## 2011-03-20 ENCOUNTER — Ambulatory Visit: Payer: Medicaid Other | Admitting: Family Medicine

## 2011-03-31 ENCOUNTER — Ambulatory Visit (INDEPENDENT_AMBULATORY_CARE_PROVIDER_SITE_OTHER): Payer: PRIVATE HEALTH INSURANCE | Admitting: Family Medicine

## 2011-03-31 ENCOUNTER — Encounter: Payer: Self-pay | Admitting: Family Medicine

## 2011-03-31 VITALS — BP 186/69 | HR 80 | Temp 98.1°F | Wt 126.9 lb

## 2011-03-31 DIAGNOSIS — R413 Other amnesia: Secondary | ICD-10-CM

## 2011-03-31 DIAGNOSIS — I10 Essential (primary) hypertension: Secondary | ICD-10-CM

## 2011-03-31 DIAGNOSIS — M1990 Unspecified osteoarthritis, unspecified site: Secondary | ICD-10-CM

## 2011-03-31 DIAGNOSIS — R011 Cardiac murmur, unspecified: Secondary | ICD-10-CM

## 2011-03-31 MED ORDER — LISINOPRIL 5 MG PO TABS: 10.0000 mg | ORAL_TABLET | Freq: Every day | ORAL | Status: DC

## 2011-03-31 MED ORDER — TRAMADOL HCL 50 MG PO TABS: 50.0000 mg | ORAL_TABLET | Freq: Two times a day (BID) | ORAL | Status: DC | PRN

## 2011-03-31 NOTE — Patient Instructions (Signed)
Nice to see you today STOP the naproxen START the tramadol DOUBLE the lisinopril  I sent new lisinopril and tramadol scripts to your pharmacy  See me in 2 weeks to recheck

## 2011-04-02 NOTE — Assessment & Plan Note (Signed)
Will retry tramadol to avoid NSAIDs.  See back in 2 weeks to recheck. Not an allergy

## 2011-04-02 NOTE — Assessment & Plan Note (Signed)
Normal echo, murmur not heard well today.

## 2011-04-02 NOTE — Progress Notes (Signed)
  Subjective:    Patient ID: Nancy Park, female    DOB: 1926/10/11, 77 y.o.   MRN: [id number]  HPI Glenford Peers- had cough and congestion but now feeling much better.  Cough lingers a little.  Dizziness-  Sometimes has room spinning feeling that lasts a day or two.  Not necessarily when she first gets up.  This has been happening less.   Review of Systems No CP, SOB, or N/V/D    Objective:   Physical Exam    Vital signs reviewed General appearance - alert, well appearing, and in no distress and oriented to person, place, and time Heart - normal rate, regular rhythm, normal S1, S2, systolic ii/v murmur, no rubs, clicks or gallops Chest - clear to auscultation, no wheezes, rales or rhonchi, symmetric air entry, no tachypnea, retractions or cyanosis Abdomen - soft, nontender, nondistended, no masses or organomegaly Neurological - alert, oriented, normal speech, no focal findings or movement disorder noted,  neck supple without rigidity, cranial nerves II through XII intact, no nystagmus noted     Assessment & Plan:

## 2011-04-02 NOTE — Assessment & Plan Note (Signed)
Would like to start aricept or namenda. Will discuss at next visit.

## 2011-04-02 NOTE — Assessment & Plan Note (Signed)
BP remains elevated on low dose lisinopril.  Will increase today.  Also will try to get off NSAIDs in face of her CKD.

## 2011-04-03 ENCOUNTER — Telehealth: Payer: Self-pay | Admitting: Family Medicine

## 2011-04-03 NOTE — Telephone Encounter (Signed)
Ms. Nancy Park too prescbribed med Nancy Park is having a rx of dizziness and vomitting.  Wanted to speak with nurse about this.

## 2011-04-03 NOTE — Telephone Encounter (Signed)
Returned call to pt.  She states that she feels dizzy.  She thinks its the tramadol.  Told her to try 2 extra strength tylenol up to TID for pain.  If that doesn't work, she should call back in 2-3 days and let me know.  I will then call her to discuss whether to restart NSAIDS

## 2011-04-03 NOTE — Telephone Encounter (Signed)
Patient was prescribed Tramadol at her OV on Friday.  She has taken it as prescribed since then.  Today she is c/o dizziness, lethargy and nausea.  Told to to stop the Tramadol, drink plenty of fluids and I would route this note to Dr. Hulen Luster for further advice.  Patient agreable.

## 2011-04-14 ENCOUNTER — Encounter: Payer: Self-pay | Admitting: Family Medicine

## 2011-04-14 ENCOUNTER — Ambulatory Visit (INDEPENDENT_AMBULATORY_CARE_PROVIDER_SITE_OTHER): Payer: PRIVATE HEALTH INSURANCE | Admitting: Family Medicine

## 2011-04-14 DIAGNOSIS — H269 Unspecified cataract: Secondary | ICD-10-CM

## 2011-04-14 DIAGNOSIS — R413 Other amnesia: Secondary | ICD-10-CM

## 2011-04-14 DIAGNOSIS — I10 Essential (primary) hypertension: Secondary | ICD-10-CM

## 2011-04-14 DIAGNOSIS — R296 Repeated falls: Secondary | ICD-10-CM

## 2011-04-14 DIAGNOSIS — M1990 Unspecified osteoarthritis, unspecified site: Secondary | ICD-10-CM

## 2011-04-14 DIAGNOSIS — Z9181 History of falling: Secondary | ICD-10-CM

## 2011-04-14 DIAGNOSIS — H547 Unspecified visual loss: Secondary | ICD-10-CM

## 2011-04-14 MED ORDER — NAPROXEN 500 MG PO TABS: 500.0000 mg | ORAL_TABLET | Freq: Two times a day (BID) | ORAL | Status: AC

## 2011-04-14 MED ORDER — LISINOPRIL 10 MG PO TABS: 10.0000 mg | ORAL_TABLET | Freq: Every day | ORAL | Status: DC

## 2011-04-14 MED ORDER — POLYETHYLENE GLYCOL 3350 17 GM/SCOOP PO POWD: ORAL | Status: DC

## 2011-04-14 NOTE — Patient Instructions (Signed)
Please make an appt for geriatric clinic  Please start using your walker

## 2011-04-15 LAB — BASIC METABOLIC PANEL
BUN: 30 mg/dL — ABNORMAL HIGH (ref 6–23)
Calcium: 10.4 mg/dL (ref 8.4–10.5)
Potassium: 4.5 mEq/L (ref 3.5–5.3)
Sodium: 142 mEq/L (ref 135–145)

## 2011-04-18 DIAGNOSIS — H269 Unspecified cataract: Secondary | ICD-10-CM | POA: Insufficient documentation

## 2011-04-18 NOTE — Assessment & Plan Note (Addendum)
Pt states that these have been examined by an opthalmalogist in the past and she would like to go back and see one.

## 2011-04-20 ENCOUNTER — Telehealth: Payer: Self-pay | Admitting: Family Medicine

## 2011-04-20 DIAGNOSIS — K219 Gastro-esophageal reflux disease without esophagitis: Secondary | ICD-10-CM

## 2011-04-20 MED ORDER — RANITIDINE HCL 150 MG PO CAPS: 150.0000 mg | ORAL_CAPSULE | Freq: Two times a day (BID) | ORAL | Status: DC

## 2011-04-20 NOTE — Telephone Encounter (Signed)
Pt says MD sent in Rx for Naprosyn but not Zantac & shes suppose to take them together, pt goes to [facility]

## 2011-04-26 DIAGNOSIS — R296 Repeated falls: Secondary | ICD-10-CM | POA: Insufficient documentation

## 2011-04-26 NOTE — Assessment & Plan Note (Signed)
Did not tolerate tramadol.  Considering pain level, unable to tolerate other meds, will put back on naproxen.  Will have to watch hydration, kidney fxn.

## 2011-04-26 NOTE — Assessment & Plan Note (Signed)
Hx of cataracts.  Will ask opthalmology to eval

## 2011-04-26 NOTE — Progress Notes (Signed)
  Subjective:    Patient ID: Nancy Park, female    DOB: 1926-10-29, 77 y.o.   MRN: [id number]  HPI  Vision- reports that her vision is getting worse.  She was told that she had cataracts that may need an operation in the future and she thinks it is time to have them checked again.  OA-unable to tolerate tramadol.  Aware of risks of NSAIDS but would like to use them for pain.    Falls-  Still falling, mostly in AM.  Reports that she mostly falls to the right, thinks this is due to weakness/cramping in that leg.  No confusion unless she takes tramadol.  No dementia on MMSE but does feels she has some memory problems occasionally.   Nonsmoker, non drinker.  Lives with her son and does the housework for him Review of Systems No CP, SOB, N/V/D    Objective:   Physical Exam    Vital signs reviewed General appearance - alert, well appearing, and in no distress and oriented to person, place, and time Heart - normal rate, regular rhythm, normal S1, S2, no murmurs, rubs, clicks or gallops Chest - clear to auscultation, no wheezes, rales or rhonchi, symmetric air entry, no tachypnea, retractions or cyanosis MSK- bilateral knees with crepitus.  Full ROM upper and lower extremities.  5/5 strength in lower ext including hip flexors.    Assessment & Plan:

## 2011-04-26 NOTE — Assessment & Plan Note (Signed)
i originally thought this was due to orthostasis, however now I think this is multifactorial, OA, vision problems, orthostasis.  Will send to geriatric clinic for eval.  Encouraged pt to use walker.

## 2011-05-04 ENCOUNTER — Ambulatory Visit (INDEPENDENT_AMBULATORY_CARE_PROVIDER_SITE_OTHER): Payer: PRIVATE HEALTH INSURANCE | Admitting: Family Medicine

## 2011-05-04 ENCOUNTER — Encounter: Payer: Self-pay | Admitting: Family Medicine

## 2011-05-04 ENCOUNTER — Other Ambulatory Visit: Payer: Self-pay | Admitting: Family Medicine

## 2011-05-04 VITALS — BP 150/72 | HR 62 | Wt 123.7 lb

## 2011-05-04 DIAGNOSIS — M1990 Unspecified osteoarthritis, unspecified site: Secondary | ICD-10-CM

## 2011-05-04 DIAGNOSIS — H547 Unspecified visual loss: Secondary | ICD-10-CM

## 2011-05-04 DIAGNOSIS — Z9181 History of falling: Secondary | ICD-10-CM

## 2011-05-04 DIAGNOSIS — I10 Essential (primary) hypertension: Secondary | ICD-10-CM

## 2011-05-04 DIAGNOSIS — R296 Repeated falls: Secondary | ICD-10-CM

## 2011-05-04 LAB — CBC
HCT: 37.3 % (ref 36.0–46.0)
Hemoglobin: 11.9 g/dL — ABNORMAL LOW (ref 12.0–15.0)
MCH: 31.2 pg (ref 26.0–34.0)
MCHC: 31.9 g/dL (ref 30.0–36.0)
MCV: 97.9 fL (ref 78.0–100.0)
RDW: 13.1 % (ref 11.5–15.5)

## 2011-05-04 LAB — TSH: TSH: 3.288 u[IU]/mL (ref 0.350–4.500)

## 2011-05-04 LAB — COMPREHENSIVE METABOLIC PANEL
ALT: 13 U/L (ref 0–35)
Albumin: 4.4 g/dL (ref 3.5–5.2)
Alkaline Phosphatase: 50 U/L (ref 39–117)
Glucose, Bld: 93 mg/dL (ref 70–99)
Potassium: 4.8 mEq/L (ref 3.5–5.3)
Sodium: 144 mEq/L (ref 135–145)
Total Bilirubin: 0.4 mg/dL (ref 0.3–1.2)
Total Protein: 6.5 g/dL (ref 6.0–8.3)

## 2011-05-04 LAB — VITAMIN B12: Vitamin B-12: 232 pg/mL (ref 211–911)

## 2011-05-04 NOTE — Patient Instructions (Signed)
Follow up with Dr. Hulen Luster in 3 months.  Try to be active - here is the information for the [facility] where you might be able to go to get some exercise.  We will check some lab work today.  It was great to meet you!  - Dr. Wallene Huh

## 2011-05-04 NOTE — Progress Notes (Signed)
Subjective:    Patient ID: Nancy Park, female    DOB: [birth year], 77 y.o.   MRN: [id number]  HPI  1) Geriatric Assessment:  - Referred by PCP (Dr. Hulen Luster) for geriatric assessment with history of recent falls. Patient reports that she has not had any falls within the past few moths (does not recall when her last fall was). She reports that unsteadiness on her legs as well as pain associated with arthritis seem to be the major factors contributing to her falls. She did have some dizziness with Ultram (given for her arthritis) pain and has since stopped taking this - she does not feel that this was related to her falls. Denies presyncope, syncope, head trauma, focal neurological signs, paresthesia, focal weakness. - Reports that her appetite is good and that she eats three meals a day with some snacks in between. On review of chart she has had a 10 lb weight loss over the past year (this has been unintentional). She is not very active - no regular exercise.  - She lives with her son and is able to perform all of her Activities of Daily Living without assistance or difficulty;  - She is able to use the telephone to arrange appointments, contact family, she uses public transportation without difficulty, her son does the shopping for food and for medicines (he does not want her to be out too much), she is able to prepare her won meals (though her son does some cooking as well), she does all of the house-cleaning (her son does the yard-work), she does not forget to take medication, she and her son handle the finances together.  - Lives in a single story home with three concrete steps (with raling placed after one of her falls) leading up to the home - Reports some decreased bilateral hearing loss especially with low sounds - this does not affect her daily life very much  - Reports bilateral cataracts - this does not affect her vision to the point that it interferes with her daily activities. She is  followed by ophthalmology for this (was told to come back in when it was affecting her to the point of needing correction)  - Denies urinary or fecal incontinence. Reports some occasional constipation relieved by Miralax.  - Reports that her overall mood is "good" and that she is "happy about life"  - She rates her overall health as "Good" as compared to others her age   Pertinent past history reviewed.   Review of Systems As per HPI (negative for chest pain, dyspnea, chronic cough, LE edema, nausea, emesis, diarrhea, melena, hematochezia, abdominal pain or distension, acute vision change, acute hearing loss, focal neurological changes)     Objective:   Physical Exam  Constitutional: She is oriented to person, place, and time. She appears well-developed and well-nourished. No distress.  HENT:  Head: Normocephalic and atraumatic.  Nose: Nose normal.  Mouth/Throat: Uvula is midline, oropharynx is clear and moist and mucous membranes are normal. She has dentures.       Bilateral cerumen impaction - removed with curette - canals now clear and TM appear normal bilaterally   Eyes: Conjunctivae and EOM are normal. Pupils are equal, round, and reactive to light.       Bilateral cataracts (hence difficult funduscopic exam)  Essentially normal visual fields.   Neck: Normal range of motion. Neck supple. No JVD present. No thyromegaly present.  Cardiovascular: Normal rate, regular rhythm, normal heart sounds and intact distal pulses.  Exam reveals no friction rub.   No murmur heard. Pulmonary/Chest: Effort normal and breath sounds normal. No respiratory distress. She has no rales.  Abdominal: Soft. Bowel sounds are normal. She exhibits no distension and no mass. There is no tenderness.  Musculoskeletal:       - 4/5 strength bilateral wrists (flex / extend), elbows (flex / extend), shoulders (flex, extend, ab/adduct), hips (flex, extend), knees (flex / extend), ankles (flex / extend).  - moderate  kyphosis without lateral scoliosis  - mildly antalgic gait protecting left leg   Mini-Mobility Exam  - able to perform "sit to stand" without difficulty - able to perform "immediate standing balance" without difficulty - able to perform "continued standing balance" without difficulty - Negative Romberg - Unable to perform tandem stance  - Able to perform "back lean" - Timed get up and go = 10 seconds   Lymphadenopathy:    She has no cervical adenopathy.  Neurological: She is alert and oriented to person, place, and time. She has normal reflexes. She displays normal reflexes. No cranial nerve deficit. She exhibits normal muscle tone. Coordination normal.       3/3 register 3/3 recall   Skin: She is not diaphoretic.          Assessment & Plan:

## 2011-05-05 NOTE — Assessment & Plan Note (Signed)
We encouraged her to take acetaminophen for joint pains to enable her to be more active

## 2011-05-05 NOTE — Assessment & Plan Note (Signed)
If systolic blood pressure remains above 140 consider adding HCTZ

## 2011-05-05 NOTE — Assessment & Plan Note (Signed)
We encouraged her to ask her opthalmologist if her cataracts should be removed to help with her unsteadiness

## 2011-05-05 NOTE — Assessment & Plan Note (Addendum)
Likely varying contributions from arthritis, deconditioning and some possible proprioceptive loss (though not overtly seen on exam testing). Advised regarding increasing activity - given information for [employer]. Control pain with NSAID as before. Patient may benefit from balance training with Physical Therapy - would consider referral in the future if this continues to be an issue.

## 2011-05-09 NOTE — Progress Notes (Signed)
Addended by: Gennette Pac on: 05/09/2011 09:05 AM   Modules accepted: Orders

## 2011-05-31 NOTE — Progress Notes (Signed)
Addended by: Zachery Dauer on: 05/31/2011 04:38 PM   Modules accepted: Orders

## 2011-08-15 ENCOUNTER — Encounter: Payer: Self-pay | Admitting: Family Medicine

## 2011-08-15 ENCOUNTER — Ambulatory Visit (INDEPENDENT_AMBULATORY_CARE_PROVIDER_SITE_OTHER): Payer: PRIVATE HEALTH INSURANCE | Admitting: Family Medicine

## 2011-08-15 DIAGNOSIS — M25569 Pain in unspecified knee: Secondary | ICD-10-CM

## 2011-08-15 DIAGNOSIS — Z23 Encounter for immunization: Secondary | ICD-10-CM

## 2011-08-15 DIAGNOSIS — I10 Essential (primary) hypertension: Secondary | ICD-10-CM

## 2011-08-15 DIAGNOSIS — M25561 Pain in right knee: Secondary | ICD-10-CM

## 2011-08-15 MED ORDER — LISINOPRIL 10 MG PO TABS: 10.0000 mg | ORAL_TABLET | Freq: Every day | ORAL | Status: DC

## 2011-08-15 NOTE — Assessment & Plan Note (Signed)
BP continues to be elevated.  Goal for her with her hx of orthostasis is <160.  I will again increase lisinopril to 10mg .  She should see me if she is unable to tolerate it.

## 2011-08-15 NOTE — Assessment & Plan Note (Signed)
Unclear etiology but not bothersome enough to pt to have further work up done.  Will have pt return if bothering her or if falling due to this

## 2011-08-15 NOTE — Patient Instructions (Signed)
I sent in a new script for 10 mg of lisinopril to you pharmacy  Please come back and see me if you can't tolerate it  Keep active with some walking!  Come back and see me in 1 month if everything is going well

## 2011-08-15 NOTE — Progress Notes (Signed)
  Subjective:    Patient ID: Nancy Park, female    DOB: June 19, 1926, 77 y.o.   MRN: [id number]  HPI Pt presents with HTN and knee pain HTN- no HA, CP or SOB.  No dizziness, no falls.  Taking 5mg  of lisinopril.  Staying active with housework.    KNee pain- occasional pain with climbing stairs that only lasts 2-3 minutes.  No falls, no weakness.  Feels like knee is catching a little.  Does not bother her much and she does not want this treated.    Review of Systems    Denies CP, SOB, HA, N/V/D, fever  Objective:   Physical Exam Vital signs reviewed General appearance - alert, well appearing, and in no distress and oriented to person, place, and time Heart - normal rate, regular rhythm, normal S1, S2, no murmurs, rubs, clicks or gallops Chest - clear to auscultation, no wheezes, rales or rhonchi, symmetric air entry, no tachypnea, retractions or cyanosis Knee- right knee with minimal crepitus, no edema or redness, no pain with palpation, normal ant/post drawer.       Assessment & Plan:  Knee pain, right Unclear etiology but not bothersome enough to pt to have further work up done.  Will have pt return if bothering her or if falling due to this  HYPERTENSION, BENIGN BP continues to be elevated.  Goal for her with her hx of orthostasis is <160.  I will again increase lisinopril to 10mg .  She should see me if she is unable to tolerate it.

## 2011-08-23 ENCOUNTER — Telehealth: Payer: Self-pay | Admitting: Family Medicine

## 2011-08-23 NOTE — Telephone Encounter (Signed)
Review for possible home visit

## 2011-08-29 LAB — POCT I-STAT, CHEM 8
Creatinine, Ser: 0.9 mg/dL (ref 0.4–1.2)
Glucose, Bld: 105 mg/dL — ABNORMAL HIGH (ref 70–99)
HCT: 44 % (ref 36.0–46.0)
Hemoglobin: 15 g/dL (ref 12.0–15.0)
Potassium: 3.8 mEq/L (ref 3.5–5.1)
Sodium: 141 mEq/L (ref 135–145)
TCO2: 30 mmol/L (ref 0–100)

## 2011-09-07 ENCOUNTER — Encounter: Payer: Self-pay | Admitting: Family Medicine

## 2011-09-15 ENCOUNTER — Ambulatory Visit (INDEPENDENT_AMBULATORY_CARE_PROVIDER_SITE_OTHER): Payer: PRIVATE HEALTH INSURANCE | Admitting: Family Medicine

## 2011-09-15 ENCOUNTER — Ambulatory Visit (HOSPITAL_COMMUNITY)
Admission: RE | Admit: 2011-09-15 | Discharge: 2011-09-15 | Disposition: A | Discharge status: Home / Self Care | Payer: PRIVATE HEALTH INSURANCE | Source: Ambulatory Visit | Attending: Family Medicine | Admitting: Family Medicine

## 2011-09-15 ENCOUNTER — Other Ambulatory Visit: Payer: Self-pay

## 2011-09-15 ENCOUNTER — Encounter: Payer: Self-pay | Admitting: Family Medicine

## 2011-09-15 VITALS — BP 182/64 | HR 78 | Temp 98.0°F | Ht 59.0 in | Wt 121.0 lb

## 2011-09-15 DIAGNOSIS — R531 Weakness: Secondary | ICD-10-CM | POA: Insufficient documentation

## 2011-09-15 DIAGNOSIS — I498 Other specified cardiac arrhythmias: Secondary | ICD-10-CM | POA: Insufficient documentation

## 2011-09-15 DIAGNOSIS — I10 Essential (primary) hypertension: Secondary | ICD-10-CM

## 2011-09-15 DIAGNOSIS — R5383 Other fatigue: Secondary | ICD-10-CM

## 2011-09-15 LAB — CBC
Hemoglobin: 11.8 g/dL — ABNORMAL LOW (ref 12.0–15.0)
MCH: 31.6 pg (ref 26.0–34.0)
MCHC: 32.7 g/dL (ref 30.0–36.0)
MCV: 96.8 fL (ref 78.0–100.0)
RBC: 3.73 MIL/uL — ABNORMAL LOW (ref 3.87–5.11)

## 2011-09-15 LAB — COMPREHENSIVE METABOLIC PANEL
AST: 20 U/L (ref 0–37)
Alkaline Phosphatase: 54 U/L (ref 39–117)
BUN: 32 mg/dL — ABNORMAL HIGH (ref 6–23)
Glucose, Bld: 94 mg/dL (ref 70–99)
Potassium: 4.4 mEq/L (ref 3.5–5.3)
Sodium: 142 mEq/L (ref 135–145)
Total Bilirubin: 0.5 mg/dL (ref 0.3–1.2)

## 2011-09-15 NOTE — Progress Notes (Signed)
  Subjective:    Patient ID: Nancy Park, female    DOB: [birth year], 77 y.o.   MRN: [id number]  HPI  Nancy Park reports that she has been feeling a little bit under the weather since her last visit. This weekend she got worse. She felt very weak and nauseous she was dizzy when she stood up. Denies any chest pain at that time she denied shortness of breath, she denied vomiting or diarrhea. She denied fevers. Blood pressure-patient is taking her blood pressure medications. She states that she still occasionally felt dizzy when she stands up. She denies chest pain.  Review of Systems See above    Objective:   Physical Exam  Vital signs reviewed General appearance - alert, well appearing, and in no distress and oriented to person, place, and time Heart - normal rate, regular rhythm, normal S1, S2, no murmurs, rubs, clicks or gallops Chest - clear to auscultation, no wheezes, rales or rhonchi, symmetric air entry, no tachypnea, retractions or cyanosis Abdomen - soft, nontender, nondistended, no masses or organomegaly Extremities - peripheral pulses normal, no pedal edema, no clubbing or cyanosis       Assessment & Plan:

## 2011-09-15 NOTE — Patient Instructions (Signed)
l will call you with your lab results to your granddaughter's phone We will come see you between 11-11:30 on Thursday

## 2011-09-15 NOTE — Assessment & Plan Note (Signed)
Patient with symptoms concerning for possible MI over the weekend. Her EKG was negative. She is currently feeling better. And not sure what the etiology of her weakness is, however I will order laboratory tests to see if anything is abnormal.

## 2011-09-15 NOTE — Assessment & Plan Note (Signed)
Blood pressure today is above goal. However, since she has frequent falls and orthostatic hypotension, I plan to keep her blood pressure medicine the same. We will recheck her in one week.

## 2011-09-21 ENCOUNTER — Ambulatory Visit: Payer: PRIVATE HEALTH INSURANCE | Admitting: Family Medicine

## 2011-09-21 ENCOUNTER — Encounter: Payer: Self-pay | Admitting: Family Medicine

## 2011-09-21 DIAGNOSIS — I359 Nonrheumatic aortic valve disorder, unspecified: Secondary | ICD-10-CM

## 2011-09-21 DIAGNOSIS — R296 Repeated falls: Secondary | ICD-10-CM

## 2011-09-21 DIAGNOSIS — Z9181 History of falling: Secondary | ICD-10-CM

## 2011-09-21 DIAGNOSIS — I10 Essential (primary) hypertension: Secondary | ICD-10-CM

## 2011-09-21 DIAGNOSIS — R413 Other amnesia: Secondary | ICD-10-CM

## 2011-09-21 DIAGNOSIS — M1990 Unspecified osteoarthritis, unspecified site: Secondary | ICD-10-CM

## 2011-09-21 DIAGNOSIS — I351 Nonrheumatic aortic (valve) insufficiency: Secondary | ICD-10-CM

## 2011-09-21 MED ORDER — OMEPRAZOLE 20 MG PO CPDR: 20.0000 mg | DELAYED_RELEASE_CAPSULE | Freq: Every day | ORAL | Status: DC

## 2011-09-21 NOTE — Assessment & Plan Note (Signed)
Examined house. Good stair rail to the outside. Likely needs a shower chair in the shower and a rail on the tub. Will order shower chair.

## 2011-09-22 DIAGNOSIS — I351 Nonrheumatic aortic (valve) insufficiency: Secondary | ICD-10-CM | POA: Insufficient documentation

## 2011-09-22 NOTE — Assessment & Plan Note (Signed)
Getting her off the daily Naprosyn may lower her blood pressure. If she has documented OH, we will recommend compression stockings. There are no electrolyte abnormalities to suggest adrenal malfunction

## 2011-09-22 NOTE — Progress Notes (Signed)
  Subjective:    Patient ID: Nancy Park, female    DOB: 09-17-1926, 77 y.o.   MRN: [id number]  HPI I saw this patient with Dr. Sheffield Slider at her home and a home visit. Today's visit was to followup on her feelings of weakness for the last clinic visit. Overall she was feeling a little bit better. She still thinks that this was related to her flu shot. She denies any chest pain or shortness of breath. She denies any runny nose cough nausea vomiting or diarrhea. She states that she does occasionally still feel dizzy and this is described as the room spinning. She will also stand up and feel off balance. Sometimes she feels her dizziness in her head and sometimes she feels in her legs. She can walk a good distance but occasionally has side steps. She walks with her daughter or her granddaughter. Today we examined her bathroom. There is likely not be enough room for seat over the toilet. There is enough room for a shower stool and a handlebar at the bathtub. She and her daughter would be willing to install these. We also examined her front stoop. She has a strong sturdy railing up the stairs. She states she does while climbing the stairs holding the railing. Her Street does not have a sidewalk. She does not go to any community centers for exercise or companionship. She is however looking forward to her daughter moving back from [place]. Currently her son and her granddaughter live with her. Patient fills her own medicine box. She has not had trouble taking her medicines. She has some memory difficulties, however she is managing well at home. She is never alone. She denies significant problems with heartburn.   Review of Systems See above    Objective:   Physical Exam Vital signs reviewed General appearance - alert, well appearing, and in no distress and oriented to person, place, and time Lungs-clear to auscultation bilaterally Heart-grade 2/6 murmur left sternal border. Regular rate and  rhythm. Extremities-patient with good range of motion in her knees. She has minimal edema in her ankles.       Assessment & Plan:

## 2011-09-22 NOTE — Progress Notes (Signed)
  Subjective:    Patient ID: Nancy Park, female    DOB: Mar 09, 1926, 77 y.o.   MRN: [id number]  HPI  Opened in error  Review of Systems     Objective:   Physical Exam        Assessment & Plan:   This encounter was created in error - please disregard.

## 2011-09-22 NOTE — Progress Notes (Signed)
  Subjective:    Patient ID: Nancy Park, female    DOB: 1925/12/04, 77 y.o.   MRN: [id number]  HPI Dr. Hulen Luster and I visited Mrs. Torrence in her home. Her daughter and granddaughter were also present. The daughter is going to be moving back from [place] to stay with Mrs. Stream. He has had no recent falls but he is unsteady on her feet. She continues to take daily Naprosyn for her arthritis same that acetaminophen does not help. She generally does a sponge bath feeling unsafe getting into the bathtub. Her arthritis primarily in pulse or knee joints.   She denies chest pain near syncope or congestive heart failure symptoms  Review of Systems     Objective:   Physical Exam is alert and pleasantly interactive. She stands and walks fairly steadily with a mildly wide-based gait, and not using her cane. Her bathroom is small and I do not believe they're be room for a chair of the toilet. He recommended a chair in the bathtub with a grab bar attached to the tub side. The family to also purchase a shower want for her to use while bathing. Alternatively it bedside commode could fit over the tub.  No carotid artery bruits normal upstroke Chest clear Heart regular rhythm with grade 2/6 systolic ejection murmur. Examined sitting Knee is mildly enlarged with full range of motion No ankle edema      Assessment & Plan:  I interviewed and examined this patient in her home and discussed the treatment plan with Dr Hulen Luster

## 2011-09-22 NOTE — Assessment & Plan Note (Signed)
Patient's creatinine has been improved. We are working to get her off her Naprosyn. I would rather she were on Tylenol for pain relief.

## 2011-09-22 NOTE — Patient Instructions (Signed)
Patient seen on home visit. Instructions given verbally to stop daily use of Naprosyn

## 2011-09-22 NOTE — Assessment & Plan Note (Signed)
This along with stiff arteries may account for her wide pulse pressure. Overdiuresis and drugs that reduce venous return like nitrites might induce syncope

## 2011-09-25 NOTE — Progress Notes (Signed)
This encounter was created in error - please disregard.

## 2011-10-13 ENCOUNTER — Other Ambulatory Visit: Payer: Self-pay | Admitting: Family Medicine

## 2011-10-13 NOTE — Telephone Encounter (Signed)
Refill request

## 2011-11-16 ENCOUNTER — Encounter: Payer: Self-pay | Admitting: Family Medicine

## 2011-11-16 ENCOUNTER — Ambulatory Visit (INDEPENDENT_AMBULATORY_CARE_PROVIDER_SITE_OTHER): Payer: PRIVATE HEALTH INSURANCE | Admitting: Family Medicine

## 2011-11-16 VITALS — BP 121/60 | HR 80 | Temp 97.9°F | Ht 59.0 in | Wt 118.4 lb

## 2011-11-16 DIAGNOSIS — I10 Essential (primary) hypertension: Secondary | ICD-10-CM

## 2011-11-16 DIAGNOSIS — Z9181 History of falling: Secondary | ICD-10-CM

## 2011-11-16 DIAGNOSIS — K5909 Other constipation: Secondary | ICD-10-CM

## 2011-11-16 DIAGNOSIS — R296 Repeated falls: Secondary | ICD-10-CM

## 2011-11-16 MED ORDER — SENNA 15 MG PO TABS: 15.0000 mg | ORAL_TABLET | Freq: Every day | ORAL | Status: DC

## 2011-11-16 NOTE — Patient Instructions (Signed)
I have sent in a referral for you to go physical therapy. The location is down [facility] we will let you know where it is when we make appointment. Please start taking senna once a day to help with constipation. If this does not help with the abdominal pain, please let me know. Please come back and see me in 3-6 months or sooner if you need me.

## 2011-11-16 NOTE — Assessment & Plan Note (Addendum)
Patient has not fallen recently but sometimes feels unsteady. She is not using her walker because she does not think she needs it. She would like to know about a cane. I will send her for evaluation at physical therapy to help her using a cane versus walker.

## 2011-11-16 NOTE — Assessment & Plan Note (Signed)
Takes MiraLAX twice a day and is only having a very small bowel movement once a day. Will include senna once a day.

## 2011-11-16 NOTE — Assessment & Plan Note (Signed)
Blood pressure well controlled on 5 mg of lisinopril. She thinks her dizziness a little bit better. Will continue this regimen

## 2011-11-16 NOTE — Progress Notes (Signed)
  Subjective:    Patient ID: Nancy Park, female    DOB: 1926-11-27, 77 y.o.   MRN: [id number]  HPI  Patient here today for followup of blood pressure, constipation, dizziness. Hypertension-patient is taking 5 mg lisinopril. She states she is occasionally slightly dizzy but this improved. She does not have orthostatic symptoms at this time. Constipation-patient is taking MiraLAX twice a day. She is a very small bowel movement once a day. She thinks she is still a little bit constipated. She has some mild abdominal pain on the left lower quadrant that is continuous. She says she is still eating well. She denies any nausea Dizziness-patient occasionally gets dizzy and lifts a little bit to one side. She has a walker but she does not think she needs this. She would like to know if she needs a cane.  Review of Systems No chest pain, shortness of breath, headache    Objective:   Physical Exam  Vital signs reviewed General appearance - alert, well appearing, and in no distress and oriented to person, place, and time Heart - normal rate, regular rhythm, normal S1, S2, grade 2 murmur systolic Chest - clear to auscultation, no wheezes, rales or rhonchi, symmetric air entry, no tachypnea, retractions or cyanosis Neuro-gait is slow but balanced. Strength equal bilaterally hip flexors      Assessment & Plan:

## 2012-01-03 ENCOUNTER — Encounter: Payer: Self-pay | Admitting: Family Medicine

## 2012-01-03 ENCOUNTER — Ambulatory Visit (INDEPENDENT_AMBULATORY_CARE_PROVIDER_SITE_OTHER): Payer: PRIVATE HEALTH INSURANCE | Admitting: Family Medicine

## 2012-01-03 VITALS — BP 190/64 | HR 67 | Temp 97.8°F | Ht 59.0 in | Wt 117.4 lb

## 2012-01-03 DIAGNOSIS — K5909 Other constipation: Secondary | ICD-10-CM

## 2012-01-03 MED ORDER — POLYETHYLENE GLYCOL 3350 17 GM/SCOOP PO POWD: ORAL | Status: DC

## 2012-01-03 NOTE — Patient Instructions (Signed)
I think your constipation is contributing to your pain today Please take MiraLAX 1 cap full in 8 ounces of juice or water every 4 hours until you have a bowel movement From then on, please take your MiraLAX twice a day. Call me if you run out  Come back in one week if your belly pain is no better or worse Call earlier if you're concerned

## 2012-01-03 NOTE — Progress Notes (Signed)
  Subjective:    Patient ID: Nancy Park, female    DOB: 1926-03-21, 78 y.o.   MRN: [id number]  HPI Patient here for abdominal pain. She states that her abdomen hurts all the time. It started about a week ago. She had one small bowel movement today that was round balls but a week ago was her last bowel movement for that. She normally does well on MiraLAX but she ran out. She denies heartburn symptoms. She denies any blood in her stool.  Review of Systems No recent fever, weight loss    Objective:   Physical Exam  Vital signs reviewed General appearance - alert, well appearing, and in no distress and oriented to person, place, and time Abdomen - mild diffuse tenderness, mild distention       Assessment & Plan:

## 2012-01-03 NOTE — Assessment & Plan Note (Signed)
Constipation worse today. We'll try MiraLAX cleanout and MiraLAX twice a day. To come back if her abdominal pain has not resolved.

## 2012-01-10 ENCOUNTER — Ambulatory Visit: Payer: PRIVATE HEALTH INSURANCE | Admitting: Family Medicine

## 2012-02-16 ENCOUNTER — Ambulatory Visit: Payer: PRIVATE HEALTH INSURANCE | Admitting: Family Medicine

## 2012-03-05 ENCOUNTER — Encounter: Payer: Self-pay | Admitting: Home Health Services

## 2012-03-08 ENCOUNTER — Ambulatory Visit
Admission: RE | Admit: 2012-03-08 | Discharge: 2012-03-08 | Disposition: A | Discharge status: Home / Self Care | Payer: PRIVATE HEALTH INSURANCE | Source: Ambulatory Visit | Attending: Family Medicine | Admitting: Family Medicine

## 2012-03-08 ENCOUNTER — Encounter: Payer: Self-pay | Admitting: Family Medicine

## 2012-03-08 ENCOUNTER — Ambulatory Visit (INDEPENDENT_AMBULATORY_CARE_PROVIDER_SITE_OTHER): Payer: PRIVATE HEALTH INSURANCE | Admitting: Family Medicine

## 2012-03-08 DIAGNOSIS — M519 Unspecified thoracic, thoracolumbar and lumbosacral intervertebral disc disorder: Secondary | ICD-10-CM | POA: Insufficient documentation

## 2012-03-08 DIAGNOSIS — R109 Unspecified abdominal pain: Secondary | ICD-10-CM

## 2012-03-08 NOTE — Progress Notes (Signed)
  Subjective:    Patient ID: Nancy Park, female    DOB: 10-22-1926, 78 y.o.   MRN: [id number]  HPI Patient presents today with her daughter and granddaughter. They're concerned because she continues to complain of back and abdominal pain. Her abdominal pain is left lower quadrant her back pain seems to be more left flank pain. This hurts her worse when she is standing for long periods. It does not feel better when she leans over. She had some relief of her back pain and abdominal pain when she had a bowel movement yesterday. This bowel movement was slightly soft but before that they have been very hard. She has not seen any blood in her bowel movements. They have not been dark. She has not had any fevers. She does not have dysuria. She is losing weight though her daughter says that she is continuing to eat well. She has not had any repeat episodes of dizziness.   Review of Systems Denies chest pain, shortness of breath. Denies nausea, vomiting, diarrhea. Denies anorexia.    Objective:   Physical Exam  Gen.-appears well, not tired, not short of breath, comfortable Heart - normal rate, regular rhythm, normal S1, S2, no murmurs, rubs, clicks or gallops Chest - clear to auscultation, no wheezes, rales or rhonchi, symmetric air entry, no tachypnea, retractions or cyanosis MSK-back has full range of motion. It is not tender to palpation. Her flank is nontender to palpation. She has no evidence of muscle spasm. Abdomen-patient is mildly tender left lower quadrant. She has normal active bowel sounds. Her abdomen seems to have some diffuse fullness on the left side. Rectal exam shows evidence of skin tags, there is some hard stool in the vault. Hemoccult was negative.      Assessment & Plan:

## 2012-03-08 NOTE — Patient Instructions (Signed)
Please take MiraLAX 3 times a day in juice. Takes senna once a day. I want you to have a bowel movement every day. Go for an x-ray of your spine. You can go to the [facility] without an appointment Please go for an ultrasound of your pelvis. This is scheduled for you.

## 2012-03-08 NOTE — Assessment & Plan Note (Signed)
Patient with flank, left lower quadrant pain and weight loss. I have considered osteoporotic fracture, degenerative disc disease, ovarian malignancy, colon cancer, constipation. I am evaluating her with a lumbar spine film, KUB, pelvic ultrasound. Her Hemoccult was negative. Patient does not desire a colonoscopy and since her Hemoccult was negative I will not send her to GI at this time. I am treating her constipation with MiraLAX and senna. Hopefully this will provide some relief to her abdominal pain. I will see her back in 2 weeks to reevaluate.

## 2012-03-11 ENCOUNTER — Ambulatory Visit
Admission: RE | Admit: 2012-03-11 | Discharge: 2012-03-11 | Disposition: A | Discharge status: Home / Self Care | Payer: PRIVATE HEALTH INSURANCE | Source: Ambulatory Visit | Attending: Family Medicine | Admitting: Family Medicine

## 2012-03-11 ENCOUNTER — Telehealth: Payer: Self-pay | Admitting: *Deleted

## 2012-03-11 ENCOUNTER — Ambulatory Visit (HOSPITAL_COMMUNITY)
Admission: RE | Admit: 2012-03-11 | Discharge: 2012-03-11 | Disposition: A | Discharge status: Home / Self Care | Payer: PRIVATE HEALTH INSURANCE | Source: Ambulatory Visit | Attending: Family Medicine | Admitting: Family Medicine

## 2012-03-11 DIAGNOSIS — N949 Unspecified condition associated with female genital organs and menstrual cycle: Secondary | ICD-10-CM | POA: Insufficient documentation

## 2012-03-11 DIAGNOSIS — R1032 Left lower quadrant pain: Secondary | ICD-10-CM | POA: Insufficient documentation

## 2012-03-11 NOTE — Telephone Encounter (Signed)
[facility] lumber spine that was missed by [facility] since she is coming in today for U/S, I was told that it would not be a problem and they would take care of it.Samariya Rockhold, Rodena Medin

## 2012-03-12 ENCOUNTER — Telehealth: Payer: Self-pay | Admitting: Family Medicine

## 2012-03-12 NOTE — Telephone Encounter (Signed)
Please contact patient back for latest test results

## 2012-03-13 ENCOUNTER — Other Ambulatory Visit: Payer: Self-pay | Admitting: Family Medicine

## 2012-03-13 DIAGNOSIS — M549 Dorsalgia, unspecified: Secondary | ICD-10-CM

## 2012-03-13 DIAGNOSIS — R9389 Abnormal findings on diagnostic imaging of other specified body structures: Secondary | ICD-10-CM

## 2012-03-13 NOTE — Telephone Encounter (Signed)
Daughter is calling for results for her mother.

## 2012-03-13 NOTE — Telephone Encounter (Signed)
Left voicemail on daughter's answering machine. Called pt back to discuss results.  Pt needs to have referral to GYN because of an abnormality in her uterus, her endometrium was enlarged. Her ovaries looked fine. This is not likely to be the cause of her pain. Her pain may be due to her changes in her back with her spine, which is due to aging and osteoporosis. We will try to treat her pain as best  we can. She does not do well with pain medicine, so she should continue with her naproxen.  Will send her to physical therapy to help her with pain and mobility.

## 2012-03-13 NOTE — Telephone Encounter (Signed)
Granddaughter called back for results.  Will await physician's return call.

## 2012-03-13 NOTE — Telephone Encounter (Signed)
Daughter is calling to get results from Dr. Hulen Luster.

## 2012-03-14 ENCOUNTER — Telehealth: Payer: Self-pay | Admitting: Family Medicine

## 2012-03-14 NOTE — Telephone Encounter (Signed)
Spoke with [name] (patient granddaughter) and informed her of message from MD, she expressed understanding.

## 2012-03-14 NOTE — Telephone Encounter (Signed)
Daughter is calling for Ultra [employer] and Xray results.  She says she has been calling for 2 days with no call back.

## 2012-03-15 ENCOUNTER — Other Ambulatory Visit: Payer: PRIVATE HEALTH INSURANCE | Admitting: Obstetrics & Gynecology

## 2012-03-22 ENCOUNTER — Ambulatory Visit (INDEPENDENT_AMBULATORY_CARE_PROVIDER_SITE_OTHER): Payer: PRIVATE HEALTH INSURANCE | Admitting: Family Medicine

## 2012-03-22 ENCOUNTER — Encounter: Payer: Self-pay | Admitting: Family Medicine

## 2012-03-22 VITALS — BP 132/53 | HR 61 | Temp 97.9°F | Ht 59.0 in | Wt 112.0 lb

## 2012-03-22 DIAGNOSIS — M519 Unspecified thoracic, thoracolumbar and lumbosacral intervertebral disc disorder: Secondary | ICD-10-CM

## 2012-03-22 MED ORDER — POLYETHYLENE GLYCOL 3350 17 GM/SCOOP PO POWD: ORAL | Status: DC

## 2012-03-22 NOTE — Patient Instructions (Signed)
I have refilled her MiraLAX today Please come back and see me at the end of May or beginning of June  come back earlier if your pain is worse

## 2012-03-23 ENCOUNTER — Encounter: Payer: Self-pay | Admitting: Family Medicine

## 2012-03-23 NOTE — Assessment & Plan Note (Signed)
Back and abd pain.  Not much improved today but was unable to obtain miralax (out of RX).  Has appt for PT and GYN.  Continue to follow closely.

## 2012-03-23 NOTE — Progress Notes (Signed)
  Subjective:    Patient ID: Nancy Park, female    DOB: 03-07-1926, 78 y.o.   MRN: [id number]  HPI Met with pt and daughter and granddaughter to go over lab results and discuss plan.  They were unable to get the miralax because they ran out.  Her pain is sometimes bad, but mostly ok per pt.  She has been doing lots of housework to keep busy while staying in daughters house.  We discussed the US findings and need for GYN eval.  We discussed how her degeneration of her spine is the likely cause of her back pain and how a back surgery may not be a good option.  I offered referral to ortho to discuss, but they refused.  Pt and family want to avoid surgery where possible. Pt still does not want to pursue GI referral for evaulation.   Overall, she has been doing well, maintaining her strength, no falls or balance issues recently.    We spoke about her care for 20 minutes.  I answered her and her daughter's questions.   Review of Systems     Objective:   Physical Exam        Assessment & Plan:

## 2012-03-27 ENCOUNTER — Ambulatory Visit
Payer: PRIVATE HEALTH INSURANCE | Attending: Family Medicine | Admitting: Rehabilitative and Restorative Service Providers"

## 2012-04-08 ENCOUNTER — Other Ambulatory Visit (HOSPITAL_COMMUNITY)
Admission: RE | Admit: 2012-04-08 | Discharge: 2012-04-08 | Disposition: A | Discharge status: Home / Self Care | Payer: PRIVATE HEALTH INSURANCE | Source: Ambulatory Visit | Attending: Obstetrics and Gynecology | Admitting: Obstetrics and Gynecology

## 2012-04-08 ENCOUNTER — Encounter: Payer: Self-pay | Admitting: Obstetrics and Gynecology

## 2012-04-08 ENCOUNTER — Ambulatory Visit (INDEPENDENT_AMBULATORY_CARE_PROVIDER_SITE_OTHER): Payer: PRIVATE HEALTH INSURANCE | Admitting: Family Medicine

## 2012-04-08 VITALS — BP 139/70 | HR 76 | Temp 97.9°F | Ht 58.5 in | Wt 114.6 lb

## 2012-04-08 DIAGNOSIS — N859 Noninflammatory disorder of uterus, unspecified: Secondary | ICD-10-CM | POA: Insufficient documentation

## 2012-04-08 DIAGNOSIS — R935 Abnormal findings on diagnostic imaging of other abdominal regions, including retroperitoneum: Secondary | ICD-10-CM

## 2012-04-08 NOTE — Progress Notes (Addendum)
  Subjective:    Patient ID: Maryella Shivers, female    DOB: 04/23/1926, 78 y.o.   MRN: [id number]  HPI This 78 yo female was referred to me for concerns of abnormal pelvic ultrasound that showed endometrial thickness of 7mm and cystic areas throughout the endometrium.  Uterus size was 5 x 4.2 x 2.3 cm.  She denies vaginal bleeding, weight gain, weight loss.  She does admit to left side and back pain, which is a long standing issue for her.  Her history includes degenerative disc disease.   Review of Systems     Objective:   Physical Exam  Constitutional: She is oriented to person, place, and time. She appears well-developed and well-nourished.  Genitourinary:       Thin vaginal tissue, consistent with postmenopausal state.  No lesions on cervix.   Neurological: She is alert and oriented to person, place, and time.  Skin: Skin is warm and dry.  Psychiatric: She has a normal mood and affect. Her behavior is normal. Judgment and thought content normal.      Assessment & Plan:  1.  Abnormal Endometrium on [place] With differential diagnosis including neoplasm, recommended to the patient to have biopsy done.  Patient consented - risks explained.  See procedure note below.  Follow up in 2-3 weeks for results.  2.  Left side and back pain - Discussed with patient that this is unlikely to be related to her US findings in the uterus, but rather caused by her degenerative disc disease.

## 2012-04-08 NOTE — Patient Instructions (Signed)
Endometrial Biopsy This is a test in which a tissue sample (a biopsy) is taken from inside the uterus (womb). It is then looked at by a specialist under a microscope to see if the tissue is normal or abnormal. The endometrium is the lining of the uterus. This test helps determine where you are in your menstrual cycle and how hormone levels are affecting the lining of the uterus. Another use for this test is to diagnose endometrial cancer, tuberculosis, polyps, or inflammatory conditions and to evaluate uterine bleeding. PREPARATION FOR TEST No preparation or fasting is necessary. NORMAL FINDINGS No pathologic conditions. Presence of "secretory-type" endometrium 3 to 5 days before to normal menstruation. Ranges for normal findings may vary among different laboratories and hospitals. You should always check with your doctor after having lab work or other tests done to discuss the meaning of your test results and whether your values are considered within normal limits. MEANING OF TEST  Your caregiver will go over the test results with you and discuss the importance and meaning of your results, as well as treatment options and the need for additional tests if necessary. OBTAINING THE TEST RESULTS It is your responsibility to obtain your test results. Ask the lab or department performing the test when and how you will get your results. Document Released: 03/16/2005 Document Revised: 11/02/2011 Document Reviewed: 10/23/2008 [employer] Patient Information 2012 [employer]. 

## 2012-04-08 NOTE — Progress Notes (Signed)
Addended by: Levie Heritage on: 04/08/2012 04:44 PM   Modules accepted: Orders

## 2012-04-18 ENCOUNTER — Other Ambulatory Visit: Payer: PRIVATE HEALTH INSURANCE | Admitting: Obstetrics & Gynecology

## 2012-04-23 ENCOUNTER — Encounter: Payer: Self-pay | Admitting: Family Medicine

## 2012-04-23 ENCOUNTER — Ambulatory Visit (INDEPENDENT_AMBULATORY_CARE_PROVIDER_SITE_OTHER): Payer: PRIVATE HEALTH INSURANCE | Admitting: Family Medicine

## 2012-04-23 VITALS — BP 153/67 | HR 88 | Temp 98.3°F | Ht 58.5 in | Wt 114.0 lb

## 2012-04-23 DIAGNOSIS — R252 Cramp and spasm: Secondary | ICD-10-CM

## 2012-04-23 DIAGNOSIS — M1990 Unspecified osteoarthritis, unspecified site: Secondary | ICD-10-CM

## 2012-04-23 DIAGNOSIS — M519 Unspecified thoracic, thoracolumbar and lumbosacral intervertebral disc disorder: Secondary | ICD-10-CM

## 2012-04-23 DIAGNOSIS — K5909 Other constipation: Secondary | ICD-10-CM

## 2012-04-23 LAB — BASIC METABOLIC PANEL
CO2: 26 mEq/L (ref 19–32)
Glucose, Bld: 89 mg/dL (ref 70–99)
Potassium: 4.3 mEq/L (ref 3.5–5.3)
Sodium: 142 mEq/L (ref 135–145)

## 2012-04-23 MED ORDER — NAPROXEN 500 MG PO TABS: 500.0000 mg | ORAL_TABLET | Freq: Two times a day (BID) | ORAL | Status: DC | PRN

## 2012-04-23 MED ORDER — POLYETHYLENE GLYCOL 3350 17 GM/SCOOP PO POWD: ORAL | Status: DC

## 2012-04-23 NOTE — Patient Instructions (Signed)
I have refilled her naproxen and her MiraLAX Make sure you go to your GYN appointment Come back and see me before the end of June to check in

## 2012-04-25 ENCOUNTER — Ambulatory Visit: Payer: PRIVATE HEALTH INSURANCE | Admitting: Family Medicine

## 2012-04-29 ENCOUNTER — Encounter: Payer: Self-pay | Admitting: Family Medicine

## 2012-04-29 NOTE — Progress Notes (Signed)
  Subjective:    Patient ID: Nancy Park, female    DOB: 09/11/1926, 78 y.o.   MRN: [id number]  HPI Abdominal pain-this is significantly improved and patient rarely has episodes of abdominal pain for the last 2-3 weeks. She's been having a bowel movement every day that is soft since she started the senna and has been back on the MiraLAX. She denies any blood in her stool. She has not lost weight or had any fevers.  Back pain-this is still present especially with standing. No change in the symptoms, no weakness. No loss of bowel or bladder or numbness. Using naproxen for pain relief.  Leg cramping-patient will occasionally get leg cramping at night. This happens 2-3 times a week. This usually lasts about 10 to 20 minutes. She is walking less than normal but is not having any pain during the day.  Currently living with her daughter and granddaughter who are both at this appointment today. Review of Systems No headaches, shortness of breath, chest pain. No nausea, vomiting, diarrhea    Objective:   Physical Exam Vital signs reviewed General appearance - alert, well appearing, and in no distress Heart - normal rate, regular rhythm, normal S1, S2, Chest - clear to auscultation, no wheezes, rales or rhonchi, symmetric air entry, no tachypnea, retractions or cyanosis Abdomen - soft, nontender, nondistended, no masses or organomegaly Extremities - peripheral pulses normal, no pedal edema, no clubbing or cyanosis         Assessment & Plan:

## 2012-04-29 NOTE — Assessment & Plan Note (Signed)
Intermittent leg cramping at night. BMET checked today and was normal. We discussed getting some mild physical activity including walking during the day and we discussed over-the-counter remedies including mustard. She is to return if this is worse or no better.

## 2012-04-29 NOTE — Assessment & Plan Note (Signed)
Much improved with senna and MiraLAX. Patient's abdominal pain has improved significantly. We discussed getting a CT scan if this returned.

## 2012-05-22 ENCOUNTER — Ambulatory Visit: Payer: PRIVATE HEALTH INSURANCE | Admitting: Family Medicine

## 2012-05-23 ENCOUNTER — Ambulatory Visit (INDEPENDENT_AMBULATORY_CARE_PROVIDER_SITE_OTHER): Payer: PRIVATE HEALTH INSURANCE | Admitting: Family Medicine

## 2012-05-23 ENCOUNTER — Ambulatory Visit: Payer: PRIVATE HEALTH INSURANCE | Admitting: Obstetrics & Gynecology

## 2012-05-23 ENCOUNTER — Ambulatory Visit: Payer: PRIVATE HEALTH INSURANCE | Admitting: Family Medicine

## 2012-05-23 ENCOUNTER — Encounter: Payer: Self-pay | Admitting: Family Medicine

## 2012-05-23 VITALS — BP 144/68 | HR 61 | Ht 58.5 in | Wt 113.0 lb

## 2012-05-23 DIAGNOSIS — M519 Unspecified thoracic, thoracolumbar and lumbosacral intervertebral disc disorder: Secondary | ICD-10-CM

## 2012-05-23 MED ORDER — ACETAMINOPHEN 500 MG PO TABS: 1000.0000 mg | ORAL_TABLET | Freq: Three times a day (TID) | ORAL | Status: AC | PRN

## 2012-05-23 NOTE — Patient Instructions (Signed)
It was good to see today I am starting you on Tylenol to be used in conjunction with your naproxen for your back pain Come back for a followup visit in 2-3 weeks If your back pain persists this point, we may consider narcotics. We have to be careful with this because of your constipation. Call if any questions

## 2012-05-24 ENCOUNTER — Ambulatory Visit: Payer: PRIVATE HEALTH INSURANCE | Admitting: Family Medicine

## 2012-05-24 NOTE — Assessment & Plan Note (Signed)
Discussed alternating regimen of tylenol and ibuprofen.  Would like to avoid narcotics given persistent constipation at baseline.  Follow up in 2-4 weeks with PCP.

## 2012-05-24 NOTE — Progress Notes (Signed)
  Subjective:    Patient ID: Nancy Park, female    DOB: [birth year], 78 y.o.   MRN: [id number]  HPI Patient presents today back pain followup. Patient is beginning followed for this by Dr. Hulen Luster. Patient with noted multiple site osteoarthritis as well as lumbar disc disease.  Pain is  predominantly in back and legs Patient has been using NSAIDs with mildly for pain. Patient is here with daughter. Daughter is wondering if this extremity give her. Patient is a baseline history of chronic constipation on multiple laxatives medications for this. Back pain seems he worse in the morning and gets better throughout the day. Back pain also seems to flare at night.    Review of Systems See HPI, otherwise ROS negative     Objective:   Physical Exam Gen: up in chair, NAD, generally frail appearing HEENT: NCAT, EOMI, TMs clear bilaterally CV: RRR, no murmurs auscultated PULM: CTAB, no wheezes, rales, rhoncii ABD: S/NT/+ bowel sounds  EXT: 2+ peripheral pulses MSK: + TTP in lumbar region         Assessment & Plan:

## 2012-06-07 ENCOUNTER — Ambulatory Visit: Payer: PRIVATE HEALTH INSURANCE | Admitting: Family Medicine

## 2012-06-10 ENCOUNTER — Ambulatory Visit: Payer: PRIVATE HEALTH INSURANCE | Admitting: Family Medicine

## 2012-06-20 ENCOUNTER — Telehealth: Payer: Self-pay | Admitting: Family Medicine

## 2012-06-20 NOTE — Telephone Encounter (Signed)
Spoke with daughter, told her I was following up on the letter we sent to offer patient a free follow up visit with Arlys John. Daughter will have patient call office to schedule.

## 2012-06-21 ENCOUNTER — Other Ambulatory Visit: Payer: Self-pay | Admitting: Family Medicine

## 2012-07-11 ENCOUNTER — Ambulatory Visit (INDEPENDENT_AMBULATORY_CARE_PROVIDER_SITE_OTHER): Payer: PRIVATE HEALTH INSURANCE | Admitting: Family Medicine

## 2012-07-11 ENCOUNTER — Encounter: Payer: Self-pay | Admitting: Family Medicine

## 2012-07-11 VITALS — BP 165/62 | HR 84 | Temp 98.3°F | Ht 58.5 in | Wt 113.0 lb

## 2012-07-11 DIAGNOSIS — K5909 Other constipation: Secondary | ICD-10-CM

## 2012-07-11 DIAGNOSIS — I10 Essential (primary) hypertension: Secondary | ICD-10-CM

## 2012-07-11 DIAGNOSIS — M1990 Unspecified osteoarthritis, unspecified site: Secondary | ICD-10-CM

## 2012-07-11 MED ORDER — FLUTICASONE PROPIONATE 50 MCG/ACT NA SUSP: 1.0000 | Freq: Every day | NASAL | Status: DC

## 2012-07-11 MED ORDER — LISINOPRIL 10 MG PO TABS: 10.0000 mg | ORAL_TABLET | Freq: Every day | ORAL | Status: DC

## 2012-07-11 MED ORDER — POLYETHYLENE GLYCOL 3350 17 GM/SCOOP PO POWD: ORAL | Status: DC

## 2012-07-11 MED ORDER — OMEPRAZOLE 20 MG PO CPDR: 20.0000 mg | DELAYED_RELEASE_CAPSULE | Freq: Every day | ORAL | Status: DC

## 2012-07-11 MED ORDER — NAPROXEN 500 MG PO TABS: 500.0000 mg | ORAL_TABLET | Freq: Two times a day (BID) | ORAL | Status: DC | PRN

## 2012-07-11 NOTE — Patient Instructions (Signed)
It was nice to meet you today. I have refilled your medications today. Please pick these up and make sure you take your Lisinopril for your blood pressure.  Make an appointment on your way out to see me in about 6-8 weeks to check your blood pressure.

## 2012-07-14 NOTE — Assessment & Plan Note (Signed)
Will continue Naprosyn for now. Patient given form for DMV to get handicap placard. Will RTC in 3 months for follow up. Will plan on getting Bmet at that time.

## 2012-07-14 NOTE — Assessment & Plan Note (Signed)
Will refill Miralax and continue to encourage her to take daily. SHe is pleased with her results, and I am as well.

## 2012-07-14 NOTE — Assessment & Plan Note (Addendum)
Out of Lisinopril recently and BP elevated today. Patient asymptomatic. Will restart Lisinopril 10mg  at prior dose. RTC in 3 months for follow up. Will check BMet at next visit

## 2012-07-14 NOTE — Progress Notes (Signed)
Subjective:     Patient ID: Maryella Shivers, female   DOB: Mar 30, 1926, 78 y.o.   MRN: [id number]  HPI Patient is a 78 yo F presenting for follow up appointment; requesting refills as well as a handicap placard. She also complains of leg pain  1. Leg Pain- Bilateral leg pain which she states is not currently bothering her. Walking makes it worse, and she is stiff when she gets out of bed in the morning. She takes Naprosyn which does help her pain. She does not like anything stronger than that. Daughter is present at the visit and states that Ms. Macken has lower back problems that make her legs hurt worse. She denies any numbness/tingling of legs, and no loss of bowel or bladder. 2. Hypertension- Patient's BP elevated to 165/62 in triage today. She has been out of her medications for a few days and states that is why her blood pressure is up. She tolerates the Lisinopril well with no side effects. She denies HA, changes in vision, leg edema. She does not check her BP outside of the office.  3. Constipation- Much improved on Miralax. She takes it along with Senna daily. She states she still has some difficulty moving her bowels but it is better. She denies abd pain, loose watery stools or rectal bleeding. Needs refill on Miralax. 4. Health Maintenance- Patient will need flu vaccine in a few months.  History reviewed: Nonsmoker  Review of Systems See HPI above, otherwise ROS negative    Objective:   Physical Exam  Constitutional: She appears well-developed and well-nourished. No distress.       Frail appearing elderly lady. Very pleasant  HENT:  Head: Normocephalic and atraumatic.  Cardiovascular: Normal rate, regular rhythm and normal heart sounds.   Pulmonary/Chest: Effort normal and breath sounds normal.  Abdominal: Soft. She exhibits no distension. There is no tenderness.  Musculoskeletal:       No tenderness to palpation of her legs today. Needs assistance standing but denies pain with  standing or walking in the office today.  Neurological:       Grossly intact  Skin: No rash noted.       1cm raised dry lesion on right forearm      Assessment:     78 yo F follow-up    Plan:

## 2012-07-17 ENCOUNTER — Encounter (HOSPITAL_COMMUNITY): Payer: Self-pay | Admitting: Emergency Medicine

## 2012-07-17 ENCOUNTER — Emergency Department (HOSPITAL_COMMUNITY): Payer: PRIVATE HEALTH INSURANCE

## 2012-07-17 ENCOUNTER — Emergency Department (HOSPITAL_COMMUNITY)
Admission: EM | Admit: 2012-07-17 | Discharge: 2012-07-18 | Disposition: A | Discharge status: Home / Self Care | Payer: PRIVATE HEALTH INSURANCE | Attending: Emergency Medicine | Admitting: Emergency Medicine

## 2012-07-17 DIAGNOSIS — Z833 Family history of diabetes mellitus: Secondary | ICD-10-CM | POA: Insufficient documentation

## 2012-07-17 DIAGNOSIS — Z8489 Family history of other specified conditions: Secondary | ICD-10-CM | POA: Insufficient documentation

## 2012-07-17 DIAGNOSIS — Z808 Family history of malignant neoplasm of other organs or systems: Secondary | ICD-10-CM | POA: Insufficient documentation

## 2012-07-17 DIAGNOSIS — S42301A Unspecified fracture of shaft of humerus, right arm, initial encounter for closed fracture: Secondary | ICD-10-CM

## 2012-07-17 DIAGNOSIS — Z823 Family history of stroke: Secondary | ICD-10-CM | POA: Insufficient documentation

## 2012-07-17 DIAGNOSIS — Z809 Family history of malignant neoplasm, unspecified: Secondary | ICD-10-CM | POA: Insufficient documentation

## 2012-07-17 DIAGNOSIS — W19XXXA Unspecified fall, initial encounter: Secondary | ICD-10-CM | POA: Insufficient documentation

## 2012-07-17 DIAGNOSIS — Z803 Family history of malignant neoplasm of breast: Secondary | ICD-10-CM | POA: Insufficient documentation

## 2012-07-17 DIAGNOSIS — Z7982 Long term (current) use of aspirin: Secondary | ICD-10-CM | POA: Insufficient documentation

## 2012-07-17 DIAGNOSIS — S42309A Unspecified fracture of shaft of humerus, unspecified arm, initial encounter for closed fracture: Secondary | ICD-10-CM | POA: Insufficient documentation

## 2012-07-17 NOTE — ED Notes (Signed)
PT. SLIPPED ON MUDDY GROUND THIS AFTERNOON WHILE HANGING CLOTHES , NO LOC , AMBULATORY , REPORTS PAIN AT RIGHT UPPER ARM WORSE WITH MOVEMENT/PALPATION .

## 2012-07-17 NOTE — ED Notes (Signed)
The patient states that she slipped and fell causing her to injure her right, upper arm.  She denies decreased LOC.  She states that she was not dizzy or lightheaded before or after the fall, and she states that she slipped on some muddy ground while hanging clothing on a line.

## 2012-07-18 NOTE — ED Notes (Signed)
The patient is AOx4 and comfortable with her discharge instructions. 

## 2012-07-18 NOTE — ED Notes (Signed)
Paged ortho tech 

## 2012-07-18 NOTE — ED Provider Notes (Signed)
History     CSN: [id number]  Arrival date & time 07/17/12  2049   First MD Initiated Contact with Patient 07/18/12 0020      Chief Complaint  Patient presents with  . Fall    (Consider location/radiation/quality/duration/timing/severity/associated sxs/prior treatment) HPI Comments: Patient reports she was hanging clothes around 2pm today when she slipped in the mud and fell on her right arm.  Reports pain in her right upper arm.  Denies hitting head or LOC.  Denies headache, neck or back pain.  Denies any lightheadedness, dizziness, CP, SOB, abdominal pain, N/V/D.  Denies numbness or tingling of the arm.  Denies other injury.    The history is provided by the patient.    Past Medical History  Diagnosis Date  . Depression   . Fracture of arm 2000    right    History reviewed. No pertinent past surgical history.  Family History  Problem Relation Age of Onset  . Diabetes Mother   . Cancer Mother   . Goiter Mother   . Stroke Father   . Cancer Sister 84    breast  . Cancer Brother     skin    History  Substance Use Topics  . Smoking status: Never Smoker   . Smokeless tobacco: Never Used  . Alcohol Use: No    OB History    Grav Para Term Preterm Abortions TAB SAB Ect Mult Living   3               Review of Systems  HENT: Negative for neck pain.   Respiratory: Negative for shortness of breath.   Cardiovascular: Negative for chest pain.  Gastrointestinal: Negative for nausea, vomiting, abdominal pain and diarrhea.  Musculoskeletal: Negative for back pain and arthralgias.  Neurological: Negative for dizziness, syncope, weakness, light-headedness, numbness and headaches.    Allergies  Tramadol  Home Medications   Current Outpatient Rx  Name Route Sig Dispense Refill  . ASPIRIN 81 MG PO TABS Oral Take 81 mg by mouth daily.      Marland Kitchen CALCIUM CARBONATE-VITAMIN D 600-400 MG-UNIT PO TABS Oral Take 1 tablet by mouth 2 (two) times daily.      Marland Kitchen FLUTICASONE  PROPIONATE 50 MCG/ACT NA SUSP Nasal Place 1 spray into the nose daily. 16 g 11  . LISINOPRIL 10 MG PO TABS Oral Take 1 tablet (10 mg total) by mouth daily. 30 tablet 11  . NAPROXEN 500 MG PO TABS Oral Take 500 mg by mouth 2 (two) times daily with a meal.    . OMEPRAZOLE 20 MG PO CPDR Oral Take 1 capsule (20 mg total) by mouth daily. 30 capsule 11  . POLYETHYLENE GLYCOL 3350 PO POWD Oral Take 17 g by mouth 3 (three) times daily.    . SENNA 8.6 MG PO TABS Oral Take 1 tablet by mouth daily.      BP 140/48  Pulse 60  Temp 98.8 F (37.1 C) (Oral)  Resp 16  Ht 4\' 11"  (1.499 m)  Wt 114 lb (51.71 kg)  BMI 23.03 kg/m2  SpO2 96%  Physical Exam  Nursing note and vitals reviewed. Constitutional: She appears well-developed and well-nourished. No distress.  HENT:  Head: Normocephalic and atraumatic.  Neck: Neck supple.  Cardiovascular: Normal rate and regular rhythm.   Pulmonary/Chest: Effort normal and breath sounds normal. No respiratory distress. She has no wheezes. She has no rales. She exhibits no tenderness.  Abdominal: Soft. She exhibits no distension. There is no  tenderness. There is no rebound and no guarding.  Musculoskeletal: She exhibits no edema.       Right shoulder: She exhibits decreased range of motion and bony tenderness. She exhibits no swelling, no effusion, no crepitus, no deformity, no laceration, normal pulse and normal strength.       Arms:      Pt with equal grip strengths bilaterally.  Sensation intact, distal pulses intact in all extremities.  No tenderness throughout other extremities.  Spine is nontender, no crepitus or step-offs.    Neurological: She is alert. She has normal strength. No sensory deficit. She exhibits normal muscle tone. GCS eye subscore is 4. GCS verbal subscore is 5. GCS motor subscore is 6.  Skin: She is not diaphoretic.  Psychiatric: She has a normal mood and affect. Her behavior is normal.    ED Course  Procedures (including critical care  time)  Labs Reviewed - No data to display Dg Humerus Right  07/17/2012  *RADIOLOGY REPORT*  Clinical Data: Right humeral pain after fall.  RIGHT HUMERUS - 2+ VIEW  Comparison: None.  Findings: Angulation and deformity of the right humeral neck with cortical irregularity consistent with acute fracture.  Mild lateral angulation of the distal fracture fragment.  Mild impaction of fracture fragments.  No evidence of glenohumeral subluxation.  The remainder the humerus appears otherwise intact.  No focal bone lesion or bone destruction.  No abnormal periosteal reaction.  IMPRESSION: The impacted fracture of the right humeral neck with mild angulation.   Original Report Authenticated By: Marlon Pel, M.D.    1:09 AM I discussed the patient and reviewed the xray and plan with Dr Estell Harpin.    1. Fall   2. Closed right humeral fracture       MDM  Pt with mechanical fall earlier this afternoon, found to have humeral neck fracture.  No other injury noted on exam, pt with no other complaints.  Not on blood thinners.  Placed in shoulder immobilizer, d/c home with ortho follow up.  Family states patient is unable to take strong pain medications and decline anything stronger than tylenol or ibuprofen - patient has not wanted pain medication here so I think this will be okay.  Pt was warned by ortho tech and by me to be careful with her balance and to use her cane for support.  Discussed all results with patient.  Pt given return precautions.  Pt verbalizes understanding and agrees with plan.           [place], [place] 07/18/12 [phone]

## 2012-07-19 NOTE — ED Provider Notes (Signed)
Medical screening examination/treatment/procedure(s) were performed by non-physician practitioner and as supervising physician I was immediately available for consultation/collaboration.   Andrae Claunch L Candace Ramus, MD 07/19/12 0425 

## 2012-08-12 ENCOUNTER — Telehealth: Payer: Self-pay | Admitting: Family Medicine

## 2012-08-12 NOTE — Telephone Encounter (Signed)
Pt was supposed to have a refill on her miralax - hasn't seen it yet  [facility] [employer]

## 2012-08-13 MED ORDER — POLYETHYLENE GLYCOL 3350 17 GM/SCOOP PO POWD: 17.0000 g | Freq: Three times a day (TID) | ORAL | Status: DC

## 2012-08-13 NOTE — Telephone Encounter (Signed)
I have not received a refill request. She had 11 refills on her last Rx. I have sent it to [facility] again, also with 11 refills. Vickie Melnik M. Marcas Bowsher, M.D.

## 2012-08-28 ENCOUNTER — Ambulatory Visit: Payer: PRIVATE HEALTH INSURANCE | Admitting: Family Medicine

## 2012-09-02 ENCOUNTER — Ambulatory Visit (INDEPENDENT_AMBULATORY_CARE_PROVIDER_SITE_OTHER): Payer: PRIVATE HEALTH INSURANCE | Admitting: Family Medicine

## 2012-09-02 ENCOUNTER — Encounter: Payer: Self-pay | Admitting: Family Medicine

## 2012-09-02 VITALS — BP 143/55 | HR 58 | Temp 97.6°F | Ht 59.0 in | Wt 114.0 lb

## 2012-09-02 DIAGNOSIS — I10 Essential (primary) hypertension: Secondary | ICD-10-CM

## 2012-09-02 DIAGNOSIS — R69 Illness, unspecified: Secondary | ICD-10-CM

## 2012-09-02 DIAGNOSIS — S42309A Unspecified fracture of shaft of humerus, unspecified arm, initial encounter for closed fracture: Secondary | ICD-10-CM

## 2012-09-02 DIAGNOSIS — K5909 Other constipation: Secondary | ICD-10-CM

## 2012-09-02 DIAGNOSIS — Z23 Encounter for immunization: Secondary | ICD-10-CM

## 2012-09-02 LAB — CBC
MCHC: 33.5 g/dL (ref 30.0–36.0)
RDW: 13.4 % (ref 11.5–15.5)

## 2012-09-02 MED ORDER — POLYETHYLENE GLYCOL 3350 17 GM/SCOOP PO POWD: 17.0000 g | Freq: Three times a day (TID) | ORAL | Status: DC

## 2012-09-02 NOTE — Assessment & Plan Note (Signed)
Stable. Con't Lisinopril. Will check BMet today given ACE-I use in CKD.

## 2012-09-02 NOTE — Patient Instructions (Addendum)
It was good to see you today. I am glad everything is going well. IF you have any issues at the pharmacy, you can let us know and we will do our best to fix it.  Please stop by the lab. If anything is abnormal, I will call you otherwise we will send you a letter with your results.  Come back in 6 months, or sooner if you need anything.  Nancy Park M. Kyndall Chaplin, M.D.

## 2012-09-02 NOTE — Assessment & Plan Note (Signed)
Will refill Miralax with more per each Rx. Continue multiple times per day as needed until regular and then cut back to 1-2 times per day.

## 2012-09-02 NOTE — Assessment & Plan Note (Signed)
Well- healed. No residual problems as far as I can tell. Pain is not an issue for her. She has good ROM and uses her hand.

## 2012-09-02 NOTE — Progress Notes (Signed)
Patient ID: Nancy Park, female   DOB: 1926-09-04, 78 y.o.   MRN: [id number] [facility] Amber M. Hairford, MD Phone: [phone]  Subjective: HPI: Patient is a 78 y.o. female presenting to clinic today for follow up appointment. Concerns today include: needs refills on Miralax  1. Hypertension Blood pressure at home: Does not record Blood pressure today: 143/55 Taking Meds: Yes, Lisinopril daily Side effects: None ROS: Denies headache, visual changes, nausea, vomiting, chest pain, abdominal pain or shortness of breath.  2. Broken Arm- Was seen in ED for humerus fracture. Patient states she does not have any pain. She does not take pain pills other than Naprosyn. No complaints today  3. Constipation- She was unable to get her Miralax filled at the pharmacy. Therefore, she has had some increased constipation. She has tried other OTC laxatives with little relief. She states she feels better taking Miralax daily, and takes it up to 4 times per day when she needs to. SHe does not take stool softener. Denies blood stools, abdominal pain, N/V.  History Reviewed: Non-smoker. Health Maintenance: Needs flu shot today  ROS: Please see HPI above.  Objective: Office vital signs reviewed.  Physical Examination:  General: Awake, alert. NAD HEENT: Atraumatic, normocephalic Pulm: CTAB, no wheezes Cardio: RRR, no murmurs appreciated Abdomen:+BS, soft, nontender, nondistended Extremities: No edema. Right upper arm well healed with good ROM Neuro: Grossly intact  Assessment: 78 yo F follow up appointment  Plan: See Problem List and After Visit Summary

## 2012-09-03 ENCOUNTER — Encounter: Payer: Self-pay | Admitting: Family Medicine

## 2012-09-03 LAB — BASIC METABOLIC PANEL
BUN: 21 mg/dL (ref 6–23)
CO2: 27 mEq/L (ref 19–32)
Calcium: 10.1 mg/dL (ref 8.4–10.5)
Creat: 0.84 mg/dL (ref 0.50–1.10)
Glucose, Bld: 88 mg/dL (ref 70–99)

## 2012-12-23 ENCOUNTER — Emergency Department (HOSPITAL_COMMUNITY): Payer: PRIVATE HEALTH INSURANCE

## 2012-12-23 ENCOUNTER — Encounter (HOSPITAL_COMMUNITY): Payer: Self-pay | Admitting: *Deleted

## 2012-12-23 ENCOUNTER — Emergency Department (HOSPITAL_COMMUNITY)
Admission: EM | Admit: 2012-12-23 | Discharge: 2012-12-23 | Disposition: A | Discharge status: Home / Self Care | Payer: PRIVATE HEALTH INSURANCE | Attending: Emergency Medicine | Admitting: Emergency Medicine

## 2012-12-23 DIAGNOSIS — Y929 Unspecified place or not applicable: Secondary | ICD-10-CM | POA: Insufficient documentation

## 2012-12-23 DIAGNOSIS — Y939 Activity, unspecified: Secondary | ICD-10-CM | POA: Insufficient documentation

## 2012-12-23 DIAGNOSIS — Z8659 Personal history of other mental and behavioral disorders: Secondary | ICD-10-CM | POA: Insufficient documentation

## 2012-12-23 DIAGNOSIS — Z7982 Long term (current) use of aspirin: Secondary | ICD-10-CM | POA: Insufficient documentation

## 2012-12-23 DIAGNOSIS — Z79899 Other long term (current) drug therapy: Secondary | ICD-10-CM | POA: Insufficient documentation

## 2012-12-23 DIAGNOSIS — R296 Repeated falls: Secondary | ICD-10-CM | POA: Insufficient documentation

## 2012-12-23 DIAGNOSIS — S20219A Contusion of unspecified front wall of thorax, initial encounter: Secondary | ICD-10-CM | POA: Insufficient documentation

## 2012-12-23 DIAGNOSIS — Z8781 Personal history of (healed) traumatic fracture: Secondary | ICD-10-CM | POA: Insufficient documentation

## 2012-12-23 HISTORY — DX: Essential (primary) hypertension: I10

## 2012-12-23 HISTORY — DX: Age-related osteoporosis without current pathological fracture: M81.0

## 2012-12-23 MED ORDER — NAPROXEN 500 MG PO TABS: 500.0000 mg | ORAL_TABLET | Freq: Two times a day (BID) | ORAL | Status: DC

## 2012-12-23 MED ORDER — HYDROCODONE-ACETAMINOPHEN 5-325 MG PO TABS: 1.0000 | ORAL_TABLET | ORAL | Status: DC | PRN

## 2012-12-23 NOTE — ED Notes (Signed)
Pt reports falling on Friday night.  States that she does not think that she hit her head.  Pt is reporting back and (L) rib pain at this time.  Pt has increased pain when leaning forward to tie her shoes.

## 2012-12-23 NOTE — ED Notes (Signed)
Patient transported to X-ray 

## 2012-12-23 NOTE — ED Provider Notes (Signed)
History  This chart was scribed for Glynn Octave, MD by Bennett Scrape, ED Scribe. This patient was seen in room B14C/B14C and the patient's care was started at 6:55 PM.  CSN: [id number]  Arrival date & time 12/23/12  1844   First MD Initiated Contact with Patient 12/23/12 1855      Chief Complaint  Patient presents with  . Fall     The history is provided by the patient. No language interpreter was used.    Nancy Park is a 79 y.o. female who presents to the Emergency Department complaining of posterior left rib pain after a fall 4 days ago. Pt states that she was trying to sit on the edge of her bed but missed and sat down on the floor and leaned back into the bed frame. She states that the pain is worse with bending over. She reports chronic back pain but denies changes. She denies abdominal pain, CP, neck pain, head trauma and LOC.  She reports that she has been eating and drinking normally. She reports taking 81 mg ASA daily but denies being on warfarin. She has a h/o depression, HTN, OA and DDD and denies smoking and alcohol use.  Past Medical History  Diagnosis Date  . Depression   . Fracture of arm 2000    right    No past surgical history on file.  Family History  Problem Relation Age of Onset  . Diabetes Mother   . Cancer Mother   . Goiter Mother   . Stroke Father   . Cancer Sister 3    breast  . Cancer Brother     skin    History  Substance Use Topics  . Smoking status: Never Smoker   . Smokeless tobacco: Never Used  . Alcohol Use: No    OB History    Grav Para Term Preterm Abortions TAB SAB Ect Mult Living   3               Review of Systems  A complete 10 system review of systems was obtained and all systems are negative except as noted in the HPI and PMH.   Allergies  Tramadol  Home Medications   Current Outpatient Rx  Name  Route  Sig  Dispense  Refill  . ASPIRIN 81 MG PO TABS   Oral   Take 81 mg by mouth daily.             Marland Kitchen CALCIUM CARBONATE-VITAMIN D 600-400 MG-UNIT PO TABS   Oral   Take 1 tablet by mouth 2 (two) times daily.           Marland Kitchen FLUTICASONE PROPIONATE 50 MCG/ACT NA SUSP   Nasal   Place 1 spray into the nose daily.   16 g   11   . LISINOPRIL 10 MG PO TABS   Oral   Take 1 tablet (10 mg total) by mouth daily.   30 tablet   11   . NAPROXEN 500 MG PO TABS   Oral   Take 500 mg by mouth 2 (two) times daily with a meal.         . OMEPRAZOLE 20 MG PO CPDR   Oral   Take 1 capsule (20 mg total) by mouth daily.   30 capsule   11   . POLYETHYLENE GLYCOL 3350 PO POWD   Oral   Take 17 g by mouth 3 (three) times daily.   850 g   11   .  SENNA 8.6 MG PO TABS   Oral   Take 1 tablet by mouth daily.           Triage Vitals: BP 170/53  Pulse 66  Temp 98.5 F (36.9 C)  Resp 16  SpO2 100%  Physical Exam  Nursing note and vitals reviewed. Constitutional: She is oriented to person, place, and time. She appears well-developed and well-nourished. No distress.  HENT:  Head: Normocephalic and atraumatic.  Mouth/Throat: Oropharynx is clear and moist.  Eyes: Conjunctivae normal and EOM are normal. Pupils are equal, round, and reactive to light.  Neck: Neck supple. No tracheal deviation present.  Cardiovascular: Normal rate and regular rhythm.   Pulmonary/Chest: Effort normal and breath sounds normal. No respiratory distress. She exhibits tenderness (anterior left rib cage tenderness underneath the left breast).  Abdominal: Soft. There is no tenderness.  Musculoskeletal: Normal range of motion. She exhibits tenderness.       Left lateral mid thoracic rib tenderness, no ecchymosis, no step offs  Neurological: She is alert and oriented to person, place, and time.  Skin: Skin is warm and dry.  Psychiatric: She has a normal mood and affect. Her behavior is normal.    ED Course  Procedures (including critical care time)  DIAGNOSTIC STUDIES: Oxygen Saturation is 100% on room air, normal  by my interpretation.    COORDINATION OF CARE: 7:10 PM-Discussed treatment plan which includes CXR with pt at bedside and pt agreed to plan.   8:15 PM-Pt rechecked and feels improved. She denies pain currently. Informed pt of negative x-ray. Performed US at bedside: no free fluid noted in the chest cavity. Pt decline pain medications.   9:25 PM-Pt rechecked and still denies pain. Informed pt of CT of chest being negative. Discussed discharge plan and pt agreed.  Labs Reviewed - No data to display Dg Ribs Unilateral W/chest Left  12/23/2012  *RADIOLOGY REPORT*  Clinical Data: Fall, left chest pain  LEFT RIBS AND CHEST - 3+ VIEW  Comparison: None.  Findings: Lungs are clear. No pleural effusion or pneumothorax.  Cardiomediastinal silhouette is within normal limits.  No displaced left rib fracture is seen.  IMPRESSION: No displaced left rib fracture is seen.   Original Report Authenticated By: Charline Bills, M.D.    Ct Chest Wo Contrast  12/23/2012  *RADIOLOGY REPORT*  Clinical Data: Posterior left rib pain status post fall.  CT CHEST WITHOUT CONTRAST  Technique:  Multidetector CT imaging of the chest was performed following the standard protocol without IV contrast.  Comparison: 12/23/2012 radiographs  Findings: Normal caliber aorta with scattered atherosclerotic disease of the aorta and branch vessels.  Normal heart size.  Mild aortic valve and coronary artery calcification.  No pleural or pericardial effusion.  No intrathoracic lymphadenopathy.  Limited images through the upper abdomen demonstrate large calcifications within the left renal collecting system, incompletely imaged.  In addition, there may be a stone within the left renal pelvis.  No hydronephrosis, favoring a chronic process.  The central airways are patent.  No pneumothorax.  No confluent airspace opacity.  Mild biapical scarring.  No suspicious nodules identified.  No displaced fracture identified.  IMPRESSION: No acute intrathoracic  process.  Left renal stones.   Original Report Authenticated By: Jearld Lesch, M.D.      No diagnosis found.    MDM  Mechanical fall 4 days ago hitting her left ribs on the side of the bed. Did not hit head or lose consciousness. Not on anticoagulation. Denies head,  neck, back, abdominal pain.  Tender to palpation over left lateral ribs without step-off, ecchymosis or crepitance.  Imaging negative for rib fractures. No free fluid seen on abdominal fast exam. Treat patient for rib contusion with anti-inflammatories. Follow up with PCP.  EMERGENCY DEPARTMENT [place] FAST EXAM  INDICATIONS:Blunt trauma to the Thorax and Blunt injury of abdomen  PERFORMED BY: Myself  IMAGES ARCHIVED?: No  FINDINGS: All views negative  LIMITATIONS:    INTERPRETATION:  No abdominal free fluid and No pericardial effusion  COMMENT:      I personally performed the services described in this documentation, which was scribed in my presence. The recorded information has been reviewed and is accurate.    Glynn Octave, MD 12/23/12 2132

## 2012-12-26 ENCOUNTER — Encounter: Payer: Self-pay | Admitting: Family Medicine

## 2012-12-26 ENCOUNTER — Emergency Department (HOSPITAL_COMMUNITY)
Admission: EM | Admit: 2012-12-26 | Discharge: 2012-12-26 | Discharge status: Against Medical Advice | Payer: PRIVATE HEALTH INSURANCE | Attending: Emergency Medicine | Admitting: Emergency Medicine

## 2012-12-26 ENCOUNTER — Ambulatory Visit (INDEPENDENT_AMBULATORY_CARE_PROVIDER_SITE_OTHER): Payer: PRIVATE HEALTH INSURANCE | Admitting: Family Medicine

## 2012-12-26 VITALS — BP 98/60 | HR 84 | Ht 59.0 in | Wt 110.7 lb

## 2012-12-26 DIAGNOSIS — Z5320 Procedure and treatment not carried out because of patient's decision for unspecified reasons: Secondary | ICD-10-CM | POA: Insufficient documentation

## 2012-12-26 DIAGNOSIS — S20219A Contusion of unspecified front wall of thorax, initial encounter: Secondary | ICD-10-CM | POA: Insufficient documentation

## 2012-12-26 MED ORDER — HYDROCODONE-ACETAMINOPHEN 5-325 MG PO TABS: 1.0000 | ORAL_TABLET | ORAL | Status: DC | PRN

## 2012-12-26 NOTE — Assessment & Plan Note (Signed)
S/p fall with normal chest CT. Now in increasing pain and unable to reposition. Encouraged to rest, use pillows under arm and behind back and to also use heat for the muscle pain. She was given Rx for Vicodin to last her 4 weeks until she is completely healed. Warned of dangers of taking narcotics including increasing her risk of falling and dizziness. Daughter also present for this discussion and agrees. Pt's son lives at home with her and will be able to help out. RTC in 6 weeks for routine follow up, or sooner if needed.

## 2012-12-26 NOTE — Progress Notes (Signed)
Patient ID: Nancy Park, female   DOB: 07-06-1926, 79 y.o.   MRN: [id number]  [facility] Norbert Malkin M. Leone Putman, MD Phone: [phone]   Subjective: HPI: Patient is a 79 y.o. female presenting to clinic today for follow up from fall. Fell 6 days ago in her bedroom. She had already turned off the light, went back to bed and missed the bed. She sat down on the floor instead and hit her back on the bed rail. Scooted over to the door and called for her son. No immediate pain after fall, but pain started 2 days ago and has been getting progressively worse. She is not taking anything for the pain currently. She got a Rx for 4 pills of hydrocodone which did help. She states that today every movement she makes hurts. She is eating ok, and able to do most of her daily tasks. No fevers, no LOC.  History Reviewed: Non-smoker. Health Maintenance: UTD  ROS: Please see HPI above.  Objective: Office vital signs reviewed. BP 98/60  Pulse 84  Ht 4\' 11"  (1.499 m)  Wt 110 lb 11.2 oz (50.213 kg)  BMI 22.36 kg/m2  Physical Examination:  General: Awake, alert. NAD but does have pain with movement. HEENT: Atraumatic, normocephalic Pulm: CTAB, no wheezes Cardio: RRR, no murmurs appreciated Abdomen:+BS, soft, nontender, nondistended Chest: TTP midaxillary line left side T5-T7 Extremities: No edema, moves all extremities Neuro: Grossly intact, AAOx3  Assessment: 79 y.o. female follow up from fall  Plan: See Problem List and After Visit Summary

## 2012-12-26 NOTE — Patient Instructions (Signed)
It was good to see you, I am sorry you are not feeling well.  I have prescribed you some pain medication. Please be careful taking this medication as it may increase your chance of falling and being dizzy.  Kodiak Rollyson M. Jarely Juncaj, M.D.

## 2013-06-30 ENCOUNTER — Ambulatory Visit: Payer: PRIVATE HEALTH INSURANCE | Admitting: Family Medicine

## 2013-07-09 ENCOUNTER — Ambulatory Visit: Payer: PRIVATE HEALTH INSURANCE | Admitting: Family Medicine

## 2013-08-14 ENCOUNTER — Ambulatory Visit (INDEPENDENT_AMBULATORY_CARE_PROVIDER_SITE_OTHER): Payer: PRIVATE HEALTH INSURANCE | Admitting: Family Medicine

## 2013-08-14 ENCOUNTER — Encounter: Payer: Self-pay | Admitting: Family Medicine

## 2013-08-14 VITALS — BP 156/68 | HR 68 | Temp 97.9°F | Wt 110.0 lb

## 2013-08-14 DIAGNOSIS — I10 Essential (primary) hypertension: Secondary | ICD-10-CM

## 2013-08-14 DIAGNOSIS — K5909 Other constipation: Secondary | ICD-10-CM

## 2013-08-14 DIAGNOSIS — M1990 Unspecified osteoarthritis, unspecified site: Secondary | ICD-10-CM

## 2013-08-14 DIAGNOSIS — Z23 Encounter for immunization: Secondary | ICD-10-CM

## 2013-08-14 LAB — CBC
HCT: 34.2 % — ABNORMAL LOW (ref 36.0–46.0)
MCHC: 34.8 g/dL (ref 30.0–36.0)
Platelets: 253 10*3/uL (ref 150–400)
RDW: 13.5 % (ref 11.5–15.5)
WBC: 5.3 10*3/uL (ref 4.0–10.5)

## 2013-08-14 LAB — POCT SEDIMENTATION RATE: POCT SED RATE: 15 mm/hr (ref 0–22)

## 2013-08-14 LAB — COMPREHENSIVE METABOLIC PANEL
ALT: 10 U/L (ref 0–35)
AST: 18 U/L (ref 0–37)
Albumin: 4.2 g/dL (ref 3.5–5.2)
CO2: 29 mEq/L (ref 19–32)
Calcium: 9.4 mg/dL (ref 8.4–10.5)
Chloride: 105 mEq/L (ref 96–112)
Creat: 0.83 mg/dL (ref 0.50–1.10)
Potassium: 4.5 mEq/L (ref 3.5–5.3)

## 2013-08-14 MED ORDER — OMEPRAZOLE 20 MG PO CPDR: 20.0000 mg | DELAYED_RELEASE_CAPSULE | Freq: Every day | ORAL | Status: DC

## 2013-08-14 MED ORDER — LISINOPRIL 10 MG PO TABS: 10.0000 mg | ORAL_TABLET | Freq: Every day | ORAL | Status: DC

## 2013-08-14 MED ORDER — FLUTICASONE PROPIONATE 50 MCG/ACT NA SUSP: 1.0000 | Freq: Every day | NASAL | Status: AC

## 2013-08-14 MED ORDER — POLYETHYLENE GLYCOL 3350 17 GM/SCOOP PO POWD: 17.0000 g | Freq: Three times a day (TID) | ORAL | Status: DC

## 2013-08-14 MED ORDER — NAPROXEN 500 MG PO TABS: 500.0000 mg | ORAL_TABLET | Freq: Two times a day (BID) | ORAL | Status: DC

## 2013-08-14 NOTE — Progress Notes (Signed)
Patient ID: ESTEFANA TAYLOR, female   DOB: 11-10-1926, 79 y.o.   MRN: [id number]  [facility] Mark Benecke M. Alyiah Ulloa, MD Phone: [phone]   Subjective: HPI: Patient is a 79 y.o. female presenting to clinic today for follow up. Concerns today include abdominal pain  1. Abdominal pain - Pain in stomach and back. She has history of chronic constipation, now on Miralax and she has a bowel movement most days. No pain with bowel movement. No blood in stool. She states she thinks it is related her to diet. She eats mostly sweets. She states she does not eat many fruits of vegetables. (She does not buy her own groceries so does not have much control)  2. Back pain - Lower back pain for 2-3 months. She states she has some falls, but last one was a while back. She states when she gets in the floor she is unable to get back up. She denies any numbness/tingling in legs. No bowel or bladder incontinence. She takes Naproxen as needed for pain. She is on calcium daily.    History Reviewed: Non smoker. Health Maintenance: Needs flu shot  ROS: Please see HPI above.  Objective: Office vital signs reviewed. BP 156/68  Pulse 68  Temp(Src) 97.9 F (36.6 C) (Oral)  Wt 110 lb (49.896 kg)  BMI 22.21 kg/m2  Physical Examination:  General: Awake, alert. NAD. Happy and smiling HEENT: Atraumatic, normocephalic. MMM. Neck: No masses palpated. No LAD Pulm: CTAB, no wheezes Cardio: RRR, no murmurs appreciated Abdomen:+BS, soft, nontender, nondistended. No focal findings Back: Nontender to palpation of spine and paraspinal muscles Extremities: No edema Neuro: Grossly intact. Walks with a 4-prong cane. Short stride.  Assessment: 79 y.o. female follow up  Plan: See Problem List and After Visit Summary

## 2013-08-14 NOTE — Assessment & Plan Note (Signed)
Con't miralax daily. Advised to decrease sweets in her diet and increase fiber. Cmet and CBC today. IF pain gets worse or fails to improve, let me know. F/u in 3 months.

## 2013-08-14 NOTE — Assessment & Plan Note (Signed)
Continue Naproxen prn. Sed rate wnl. Will await Cmet for other causes of back pain. Last lumbar Xray was 18 months ago, I do not anticipate to see much of a change. She is falling more frequently so will send to health coach for further investigation. F/u in 3 months.

## 2013-08-14 NOTE — Patient Instructions (Addendum)
It was good to see you today. I am sorry you are still having back pain.  I have checked some labs on you today, I will call you if anything comes back abnormal.  Please make an appointment with our Health Coach, Arlys John to make sure you are up to date on everything you need to stay healthy.  Please come back in 3 months for a follow up or sooner if needed.   Jahziel Sinn M. Willean Schurman, M.D.

## 2013-08-14 NOTE — Assessment & Plan Note (Signed)
Stable. Con't Lisinopril. Check Creat today.

## 2013-08-18 ENCOUNTER — Ambulatory Visit: Payer: PRIVATE HEALTH INSURANCE | Admitting: Home Health Services

## 2013-08-19 ENCOUNTER — Ambulatory Visit: Payer: PRIVATE HEALTH INSURANCE | Admitting: Home Health Services

## 2013-08-21 ENCOUNTER — Encounter: Payer: Self-pay | Admitting: Clinical

## 2013-08-21 ENCOUNTER — Encounter: Payer: Self-pay | Admitting: Home Health Services

## 2013-08-21 ENCOUNTER — Ambulatory Visit (INDEPENDENT_AMBULATORY_CARE_PROVIDER_SITE_OTHER): Payer: PRIVATE HEALTH INSURANCE | Admitting: Home Health Services

## 2013-08-21 VITALS — BP 128/57 | HR 71 | Temp 98.6°F | Ht 59.0 in | Wt 108.3 lb

## 2013-08-21 DIAGNOSIS — Z0000 Encounter for general adult medical examination without abnormal findings: Secondary | ICD-10-CM

## 2013-08-21 NOTE — Progress Notes (Signed)
Clinical Child psychotherapist (CSW) met with pt and Health Coach Arlys John to discuss pt potential appropriateness for the [employer]. Pt stated she would be agreeable and consented for CSW to obtain her neighbors contact number as pt does not have a phone at home and son Dannielle Huh works from 8[employer]. CSW placed a referral with Rockne Menghini who will follow up with pt neighbor Colin Mulders [phone] tomorrow. Pt also provided additional contact numbers for her family:  Chrystine Oiler [phone] daughter, [name] [phone] Daughter's, Elease Hashimoto [phone] & [name] [phone]  [name], MSW, LCSW [phone]

## 2013-08-21 NOTE — Progress Notes (Signed)
Patient here for annual wellness visit, patient reports: Risk Factors/Conditions needing evaluation or treatment: Pt present for annual wellness visit, concern around fall risk safety and early sign of dementia.  Coordinate with Dr. Perfecto Kingdom and social worker- Nelva Bush to start process for PACE placement.   Home Safety: Pt lives with son in 1 story home.  Pt reports homeless people stay with them at times.  Pt reports having disabled smoke detectors and adaptive equipment in bathroom. Other Information: Corrective lens: Pt wears daily corrective lens.  Has eye exams every two years. Dentures: Pt has full dentures. Memory: Pt reports some memory problems. Patient's Mini Mental Score (recorded in doc. flowsheet): 26  Balance/Gait: Pt is dependent on cane for balance/stability.  Pt reported several falls in the past year with injuries.   Pt reports some hearing and vision problems.     Annual Wellness Visit Requirements Recorded Today In  Medical, family, social history Past Medical, Family, Social History Section  Current providers Care team  Current medications Medications  Wt, BP, Ht, BMI Vital signs  Tobacco, alcohol, illicit drug use History  ADL Nurse Assessment  Depression Screening Nurse Assessment  Cognitive impairment Nurse Assessment  Mini Mental Status Document Flowsheet  Fall Risk Fall/Depression  Home Safety Progress Note  End of Life Planning (welcome visit) Social Documentation  Medicare preventative services Progress Note  Risk factors/conditions needing evaluation/treatment Progress Note  Personalized health advice Patient Instructions, goals, letter  Diet & Exercise Social Documentation  Emergency Contact Social Documentation  Seat Belts Social Documentation  Sun exposure/protection Social Documentation

## 2013-08-27 ENCOUNTER — Other Ambulatory Visit: Payer: Self-pay | Admitting: Family Medicine

## 2013-08-27 ENCOUNTER — Telehealth: Payer: Self-pay | Admitting: Clinical

## 2013-08-27 MED ORDER — HYDROCODONE-ACETAMINOPHEN 5-325 MG PO TABS: 1.0000 | ORAL_TABLET | ORAL | Status: DC | PRN

## 2013-08-27 NOTE — Telephone Encounter (Signed)
Clinical Child psychotherapist (CSW) received a call from pt stating her phone is now connected. Pt home number is [phone]. CSW updated pt demographic sheet to reflect her updated #.   [name], MSW, LCSW [phone]

## 2013-10-29 NOTE — Progress Notes (Signed)
Patient ID: Nancy Park, female   DOB: January 18, 1926, 79 y.o.   MRN: [id number]  I have reviewed this visit and discussed with Arlys John and agree with her documentation.  Dejean Tribby M. Vaidehi Braddy, M.D.

## 2014-03-03 ENCOUNTER — Ambulatory Visit (INDEPENDENT_AMBULATORY_CARE_PROVIDER_SITE_OTHER): Payer: PRIVATE HEALTH INSURANCE | Admitting: Family Medicine

## 2014-03-03 ENCOUNTER — Encounter: Payer: Self-pay | Admitting: Family Medicine

## 2014-03-03 VITALS — BP 152/88 | HR 64 | Temp 97.8°F | Wt 107.9 lb

## 2014-03-03 DIAGNOSIS — M519 Unspecified thoracic, thoracolumbar and lumbosacral intervertebral disc disorder: Secondary | ICD-10-CM

## 2014-03-03 DIAGNOSIS — I10 Essential (primary) hypertension: Secondary | ICD-10-CM

## 2014-03-03 MED ORDER — TETANUS-DIPHTH-ACELL PERTUSSIS 5-2.5-18.5 LF-MCG/0.5 IM SUSP
0.5000 mL | Freq: Once | INTRAMUSCULAR | Status: DC
Start: 2014-03-03 — End: 2015-02-11

## 2014-03-03 MED ORDER — HYDROCODONE-ACETAMINOPHEN 5-325 MG PO TABS: 1.0000 | ORAL_TABLET | ORAL | Status: DC | PRN

## 2014-03-03 MED ORDER — DICLOFENAC-MISOPROSTOL 50-0.2 MG PO TBEC: 1.0000 | DELAYED_RELEASE_TABLET | Freq: Two times a day (BID) | ORAL | Status: DC | PRN

## 2014-03-03 NOTE — Patient Instructions (Signed)
Take the pain pill just as needed. You can ask the pharmacist how much Arthrotec will cost, you can take this rather than Naproxen.  Follow up in 3 months, or sooner if needed.  Brittney Mucha M. Sagar Tengan, M.D.

## 2014-03-03 NOTE — Progress Notes (Signed)
Patient ID: Nancy ShiversDorothy E Mazur, female   DOB: 01/08/1926, 80 y.o.   MRN: [id number]    Subjective: HPI: Patient is a 80 y.o. female presenting to clinic today for follow up appointment.  Concerns today include None, per patient. She is brought in by her son's girlfriend who is concerned about her back pain.   1. Hypertension Blood pressure at home: Does not check Blood pressure today: 180/66 Taking Meds: Lisinopril 10mg  Side effects: None ROS: Denies headache, visual changes, nausea, vomiting, chest pain, abdominal pain or shortness of breath.  2. Back pain  She has intermittent back pain. She has had work up before. She continues to do what she wants to do at home. Walks with cane when she is in public. She fell out of the bed trying to get the dog, and she slipped on the ice. No falls due to gait. She is taking Naproxen daily which is upsetting her belly.   History Reviewed: Passive smoker. Health Maintenance: Tdap today, needs written Rx for it. Will confirm at next visit if she had it doen  ROS: Please see HPI above.  Objective: Office vital signs reviewed. BP 180/66  Pulse 64  Temp(Src) 97.8 F (36.6 C) (Oral)  Wt 107 lb 14.4 oz (48.943 kg)  Physical Examination:  General: Awake, alert. NAD. Thin elderly female. HEENT: Atraumatic, normocephalic. MMM.  Neck: No masses palpated. No LAD Pulm: CTAB, no wheezes Cardio: RRR, no murmurs appreciated Abdomen:+BS, soft, nontender, nondistended Back: Poor posture, mild TTP of lumbar spine Extremities: No edema Neuro: Walks with a cane  Assessment: 80 y.o. female follow up  Plan: See Problem List and After Visit Summary

## 2014-03-05 NOTE — Assessment & Plan Note (Signed)
A: BP is labile but elevated today. She is taking her medications daily.  P: - Con't Lisinopril 10mg . Consider increasing to 20 if she becomes symptomatic - Monitor Bmet at next visit

## 2014-03-05 NOTE — Assessment & Plan Note (Signed)
A: Persistent back pain that interferes with her daily function. Naproxen is starting to hurt her stomach  P: - Try Arthrotec to give her antiinflammatory without the GI side effects - Given Rx for Vicodin to use sparingly for severe pain. Monitor for constipation - May consider doing drug screen or pill counts if the request for pain medications increases, especially from family members.

## 2014-04-15 ENCOUNTER — Telehealth: Payer: Self-pay | Admitting: Family Medicine

## 2014-04-15 DIAGNOSIS — L602 Onychogryphosis: Secondary | ICD-10-CM

## 2014-04-15 NOTE — Telephone Encounter (Signed)
Ginger calling to ask that Nancy Park be given a refill for Vicodan.  She is still using the naproxyn.but almost out of the vicodan.  Will pick up when ready.

## 2014-04-15 NOTE — Telephone Encounter (Signed)
Will forward to MD to see if she will refill medication. Jorge Retz,CMA

## 2014-04-15 NOTE — Telephone Encounter (Signed)
Referral placed.  Nancy Park, M.D.  

## 2014-04-15 NOTE — Telephone Encounter (Signed)
Since this is a controlled substance, patient will need to be seen in clinic for any refills. I apologize. I will be happy to see her for a refill.  Thanks, Continental Airlinesmber M. Odysseus Cada, M.D.

## 2014-04-15 NOTE — Telephone Encounter (Signed)
Ginger lives with pt. Pt needs referral to podiatrist to have nails cut Please call ginger when this is done

## 2014-04-16 NOTE — Telephone Encounter (Signed)
LMOVM for callback. Princella PellegriniJessica D Rolanda Campa

## 2014-06-01 ENCOUNTER — Encounter: Payer: Self-pay | Admitting: Podiatry

## 2014-06-01 ENCOUNTER — Ambulatory Visit (INDEPENDENT_AMBULATORY_CARE_PROVIDER_SITE_OTHER): Payer: Medicare Other | Admitting: Podiatry

## 2014-06-01 VITALS — BP 188/79 | HR 63 | Resp 18

## 2014-06-01 DIAGNOSIS — M79673 Pain in unspecified foot: Secondary | ICD-10-CM

## 2014-06-01 DIAGNOSIS — M79609 Pain in unspecified limb: Secondary | ICD-10-CM

## 2014-06-01 DIAGNOSIS — B351 Tinea unguium: Secondary | ICD-10-CM

## 2014-06-01 NOTE — Progress Notes (Signed)
° °  Subjective:    Patient ID: Nancy Park, female    DOB: 10/25/1926, 80 y.o.   MRN: [id number]  HPI I AM HERE TO HAVE MY TOENAILS TRIMMED DUE TO I CAN'T CUT THEM AND THEY DO GET LONG   This patient states that the toenails are uncomfortable when walking wearing shoes. There is no previous podiatric care  Review of Systems  HENT: Positive for hearing loss.   All other systems reviewed and are negative.      Objective:   Physical Exam Orientated x3 white female  Vascular: PTs 1/4 bilaterally DPs 2/4 bilaterally  Neurological: Sensation to 10 g monofilament wire intact 5/5 bilaterally Vibratory sensation intact bilaterally Ankle flex equal and reactive bilaterally  Dermatological: Atrophic skin without hair growth bilaterally The toenails are hypertrophic, incurvated, discolored and tender to palpation x10  Musculoskeletal: Patient walks slowly with cane HAV deformities bilaterally Hammer second toes bilaterally       Assessment & Plan:   Assessment: Symptomatic onychomycoses 6-10  Plan: Nails x10 debrided without a bleeding  Reappoint when necessary at three-month intervals

## 2014-06-02 ENCOUNTER — Encounter: Payer: PRIVATE HEALTH INSURANCE | Admitting: Home Health Services

## 2014-06-02 ENCOUNTER — Encounter: Payer: Self-pay | Admitting: Family Medicine

## 2014-06-02 ENCOUNTER — Ambulatory Visit (INDEPENDENT_AMBULATORY_CARE_PROVIDER_SITE_OTHER): Payer: PRIVATE HEALTH INSURANCE | Admitting: Family Medicine

## 2014-06-02 VITALS — BP 139/41 | HR 59 | Ht 59.0 in | Wt 105.0 lb

## 2014-06-02 DIAGNOSIS — M1990 Unspecified osteoarthritis, unspecified site: Secondary | ICD-10-CM

## 2014-06-02 MED ORDER — HYDROCODONE-ACETAMINOPHEN 5-325 MG PO TABS: 1.0000 | ORAL_TABLET | ORAL | Status: DC | PRN

## 2014-06-02 NOTE — Progress Notes (Signed)
Encounter opened in error. Nancy Park Nancy Park  

## 2014-06-03 NOTE — Progress Notes (Signed)
Subjective:     Patient ID: Nancy Park, female   DOB: 09/29/1926, 80 y.o.   MRN: [id number]  HPI Comments: Work in visit for back and knee pain. She reports these are both chronic and unchanged. She has been using her norco sparingly with good pain relief. She endorses constipation (BM every ~ 2 weeks) that she treats with senna and Miralax as well as some nausea. She is somewhat confused about which medication she take and how often/ when she takes them. Her daughter is present today who says she takes care or her and gives her medications to her when needed.     Review of Systems  Constitutional: Negative for fever and chills.  Cardiovascular: Negative for chest pain and leg swelling.  Gastrointestinal: Positive for nausea and constipation. Negative for abdominal pain and blood in stool.  Musculoskeletal: Positive for arthralgias and back pain.  Neurological: Negative for dizziness and syncope.       Objective:   Physical Exam  Constitutional:  Frail thin elderly woman  Cardiovascular: Normal rate and regular rhythm.   No murmur heard. Pulmonary/Chest: Effort normal and breath sounds normal.  Musculoskeletal:  Back: Normal skin w/o rash, Spine with normal alignment and no deformity. no tenderness to vertebral process palpation.  Paraspinous muscles are non tender and without spasm.    Knee: B/l crepitus; strength 5/5  Assessment/Plan:      See Problem Focused Assessment & Plan

## 2014-06-03 NOTE — Assessment & Plan Note (Signed)
Reports pain improvement with Norco as well as improved function. Concerning that she is unable to say what/when/how she take her medications.  - Refilled Norco # 30 zero refills as this lasted 3 months before - PCP to consider UDS, as pain meds are controlled by her by family members and re discuss pros/cons (constipation, fall risk)

## 2014-07-02 ENCOUNTER — Ambulatory Visit: Payer: PRIVATE HEALTH INSURANCE | Admitting: Family Medicine

## 2014-09-28 ENCOUNTER — Encounter: Payer: Self-pay | Admitting: Family Medicine

## 2014-11-22 ENCOUNTER — Encounter (HOSPITAL_COMMUNITY): Payer: Self-pay | Admitting: Nurse Practitioner

## 2014-11-22 ENCOUNTER — Emergency Department (HOSPITAL_COMMUNITY): Payer: PRIVATE HEALTH INSURANCE

## 2014-11-22 ENCOUNTER — Emergency Department (HOSPITAL_COMMUNITY)
Admission: EM | Admit: 2014-11-22 | Discharge: 2014-11-22 | Disposition: A | Discharge status: Home / Self Care | Payer: PRIVATE HEALTH INSURANCE | Attending: Emergency Medicine | Admitting: Emergency Medicine

## 2014-11-22 DIAGNOSIS — Z8781 Personal history of (healed) traumatic fracture: Secondary | ICD-10-CM | POA: Diagnosis not present

## 2014-11-22 DIAGNOSIS — S0181XA Laceration without foreign body of other part of head, initial encounter: Secondary | ICD-10-CM | POA: Diagnosis present

## 2014-11-22 DIAGNOSIS — M810 Age-related osteoporosis without current pathological fracture: Secondary | ICD-10-CM | POA: Diagnosis not present

## 2014-11-22 DIAGNOSIS — Z7982 Long term (current) use of aspirin: Secondary | ICD-10-CM | POA: Insufficient documentation

## 2014-11-22 DIAGNOSIS — Z8659 Personal history of other mental and behavioral disorders: Secondary | ICD-10-CM | POA: Insufficient documentation

## 2014-11-22 DIAGNOSIS — W01198A Fall on same level from slipping, tripping and stumbling with subsequent striking against other object, initial encounter: Secondary | ICD-10-CM | POA: Diagnosis not present

## 2014-11-22 DIAGNOSIS — I10 Essential (primary) hypertension: Secondary | ICD-10-CM | POA: Insufficient documentation

## 2014-11-22 DIAGNOSIS — Y9389 Activity, other specified: Secondary | ICD-10-CM | POA: Diagnosis not present

## 2014-11-22 DIAGNOSIS — Y92012 Bathroom of single-family (private) house as the place of occurrence of the external cause: Secondary | ICD-10-CM | POA: Diagnosis not present

## 2014-11-22 DIAGNOSIS — Y998 Other external cause status: Secondary | ICD-10-CM | POA: Insufficient documentation

## 2014-11-22 DIAGNOSIS — Z7951 Long term (current) use of inhaled steroids: Secondary | ICD-10-CM | POA: Insufficient documentation

## 2014-11-22 DIAGNOSIS — W19XXXA Unspecified fall, initial encounter: Secondary | ICD-10-CM

## 2014-11-22 DIAGNOSIS — Z79899 Other long term (current) drug therapy: Secondary | ICD-10-CM | POA: Diagnosis not present

## 2014-11-22 HISTORY — DX: Cerebral aneurysm, nonruptured: I67.1

## 2014-11-22 MED ORDER — LIDOCAINE HCL (PF) 1 % IJ SOLN
5.0000 mL | Freq: Once | INTRAMUSCULAR | Status: DC
  Filled 2014-11-22: qty 5

## 2014-11-22 NOTE — ED Notes (Signed)
Pt slipped in shower and fell onto R side. She has laceration to R foreahead/ear with no active bleeding. She denies LOC. Family was home, heard her fall and went into bathroom immediately to find her A&O. She also c/o back pain since. She is ambulatory with assistance. She is a&o now.

## 2014-11-22 NOTE — ED Notes (Signed)
The pt  Is relaxed waiting for the c-t  comfortable

## 2014-11-22 NOTE — Discharge Instructions (Signed)
Facial Laceration  A facial laceration is a cut on the face. These injuries can be painful and cause bleeding. Lacerations usually heal quickly, but they need special care to reduce scarring. DIAGNOSIS  Your health care provider will take a medical history, ask for details about how the injury occurred, and examine the wound to determine how deep the cut is. TREATMENT  Some facial lacerations may not require closure. Others may not be able to be closed because of an increased risk of infection. The risk of infection and the chance for successful closure will depend on various factors, including the amount of time since the injury occurred. The wound may be cleaned to help prevent infection. If closure is appropriate, pain medicines may be given if needed. Your health care provider will use stitches (sutures), wound glue (adhesive), or skin adhesive strips to repair the laceration. These tools bring the skin edges together to allow for faster healing and a better cosmetic outcome. If needed, you may also be given a tetanus shot. HOME CARE INSTRUCTIONS  Only take over-the-counter or prescription medicines as directed by your health care provider.  Follow your health care provider's instructions for wound care. These instructions will vary depending on the technique used for closing the wound. For Sutures:  Keep the wound clean and dry.   If you were given a bandage (dressing), you should change it at least once a day. Also change the dressing if it becomes wet or dirty, or as directed by your health care provider.   Wash the wound with soap and water 2 times a day. Rinse the wound off with water to remove all soap. Pat the wound dry with a clean towel.   After cleaning, apply a thin layer of the antibiotic ointment recommended by your health care provider. This will help prevent infection and keep the dressing from sticking.   You may shower as usual after the first 24 hours. Do not soak the  wound in water until the sutures are removed.   Get your sutures removed as directed by your health care provider. With facial lacerations, sutures should usually be taken out after 4-5 days to avoid stitch marks.   Wait a few days after your sutures are removed before applying any makeup. For Skin Adhesive Strips:  Keep the wound clean and dry.   Do not get the skin adhesive strips wet. You may bathe carefully, using caution to keep the wound dry.   If the wound gets wet, pat it dry with a clean towel.   Skin adhesive strips will fall off on their own. You may trim the strips as the wound heals. Do not remove skin adhesive strips that are still stuck to the wound. They will fall off in time.  For Wound Adhesive:  You may briefly wet your wound in the shower or bath. Do not soak or scrub the wound. Do not swim. Avoid periods of heavy sweating until the skin adhesive has fallen off on its own. After showering or bathing, gently pat the wound dry with a clean towel.   Do not apply liquid medicine, cream medicine, ointment medicine, or makeup to your wound while the skin adhesive is in place. This may loosen the film before your wound is healed.   If a dressing is placed over the wound, be careful not to apply tape directly over the skin adhesive. This may cause the adhesive to be pulled off before the wound is healed.   Avoid   prolonged exposure to sunlight or tanning lamps while the skin adhesive is in place.  The skin adhesive will usually remain in place for 5-10 days, then naturally fall off the skin. Do not pick at the adhesive film.  After Healing: Once the wound has healed, cover the wound with sunscreen during the day for 1 full year. This can help minimize scarring. Exposure to ultraviolet light in the first year will darken the scar. It can take 1-2 years for the scar to lose its redness and to heal completely.  SEEK IMMEDIATE MEDICAL CARE IF:  You have redness, pain, or  swelling around the wound.   You see ayellowish-white fluid (pus) coming from the wound.   You have chills or a fever.  MAKE SURE YOU:  Understand these instructions.  Will watch your condition.  Will get help right away if you are not doing well or get worse. Document Released: 12/21/2004 Document Revised: 09/03/2013 Document Reviewed: 06/26/2013 Exit[employer] Patient Information 2015 [employer]. This information is not intended to replace advice given to you by your health care provider. Make sure you discuss any questions you have with your health care provider.  

## 2014-11-22 NOTE — ED Provider Notes (Signed)
CSN: [id number]     Arrival date & time 11/22/14  1619 History   First MD Initiated Contact with Patient 11/22/14 1752     Chief Complaint  Patient presents with  . Fall     (Consider location/radiation/quality/duration/timing/severity/associated sxs/prior Treatment) Patient is a 80 y.o. female presenting with fall. The history is provided by the patient.  Fall Pertinent negatives include no abdominal pain and no headaches.   patient with a mechanical fall. Slipped getting out of the shower. Laceration to the front of right ear. No headache. No confusion. She is at her baseline. She is not on anticoagulation. No difficulty hearing. She does have a history of brain aneurysm.  Past Medical History  Diagnosis Date  . Depression   . Fracture of arm 2000    right  . Hypertension   . Osteoporosis   . Brain aneurysm    History reviewed. No pertinent past surgical history. Family History  Problem Relation Age of Onset  . Diabetes Mother   . Cancer Mother   . Goiter Mother   . Stroke Father   . Cancer Sister [id number]    breast  . Cancer Brother     skin   History  Substance Use Topics  . Smoking status: Never Smoker   . Smokeless tobacco: Never Used  . Alcohol Use: No   OB History    Gravida Para Term Preterm AB TAB SAB Ectopic Multiple Living   3              Review of Systems  Constitutional: Negative for fatigue.  Eyes: Negative for visual disturbance.  Respiratory: Negative for choking.   Gastrointestinal: Negative for abdominal pain.  Musculoskeletal: Negative for neck pain.  Skin: Positive for wound.  Neurological: Negative for headaches.  Hematological: Does not bruise/bleed easily.      Allergies  Tramadol  Home Medications   Prior to Admission medications   Medication Sig Start Date End Date Taking? Authorizing Provider  aspirin EC 81 MG tablet Take 81 mg by mouth daily.   Yes Historical Provider, MD  Calcium Carbonate-Vitamin D (CALCIUM 600-D) 600-400  MG-UNIT per tablet Take 1 tablet by mouth 2 (two) times daily.     Yes Historical Provider, MD  fluticasone (FLONASE) 50 MCG/ACT nasal spray Place 1 spray into the nose daily. 08/14/13  Yes Amber Nydia BoutonM Hairford, MD  senna (SENOKOT) 8.6 MG TABS Take 1 tablet by mouth daily.   Yes Historical Provider, MD  Diclofenac-Misoprostol 50-0.2 MG TBEC Take 1 tablet by mouth 2 (two) times daily as needed. Patient not taking: Reported on 11/22/2014 03/03/14   Hilarie FredricksonAmber M Hairford, MD  HYDROcodone-acetaminophen (NORCO/VICODIN) 5-325 MG per tablet Take 1 tablet by mouth every 4 (four) hours as needed. Patient not taking: Reported on 11/22/2014 06/02/14   Jamal CollinJames R Joyner, MD  lisinopril (PRINIVIL,ZESTRIL) 10 MG tablet Take 1 tablet (10 mg total) by mouth daily. 08/14/13 08/14/14  Amber Nydia BoutonM Hairford, MD  naproxen (NAPROSYN) 500 MG tablet Take 1 tablet (500 mg total) by mouth 2 (two) times daily with a meal. Patient not taking: Reported on 11/22/2014 08/14/13   Hilarie FredricksonAmber M Hairford, MD  omeprazole (PRILOSEC) 20 MG capsule Take 1 capsule (20 mg total) by mouth daily. 08/14/13 08/14/14  Amber Nydia BoutonM Hairford, MD  polyethylene glycol powder (GLYCOLAX/MIRALAX) powder Take 17 g by mouth 3 (three) times daily. Patient not taking: Reported on 11/22/2014 08/14/13   Hilarie FredricksonAmber M Hairford, MD  Tdap Leda Min(BOOSTRIX) 5-2.5-18.5 LF-MCG/0.5 injection Inject 0.5 mLs  into the muscle once. Patient not taking: Reported on 11/22/2014 03/03/14   Hilarie FredricksonAmber M Hairford, MD   BP 119/52 mmHg  Pulse 71  Temp(Src) 97.4 F (36.3 C) (Oral)  Resp 23  SpO2 99% Physical Exam  Constitutional: She appears well-developed and well-nourished.  HENT:  2 cm vertical laceration anterior to the superior part of the right ear. Right TM normal.  Eyes: Pupils are equal, round, and reactive to light.  Neck: Normal range of motion. Neck supple.  Pulmonary/Chest: Effort normal.  Abdominal: Soft.  Musculoskeletal: She exhibits no tenderness.  Neurological: She is alert.  Skin: Skin is warm.     ED Course  Procedures (including critical care time) Labs Review Labs Reviewed - No data to display  Imaging Review Ct Head Wo Contrast  11/22/2014   CLINICAL DATA:  Status post fall tonight with a laceration about the right side of the ear and forehead.  EXAM: CT HEAD WITHOUT CONTRAST  TECHNIQUE: Contiguous axial images were obtained from the base of the skull through the vertex without intravenous contrast.  COMPARISON:  Head CT scan 02/23/2008.  FINDINGS: Mild atrophy and chronic microvascular ischemic change are noted. No evidence of acute abnormality including hemorrhage, infarct, mass lesion, mass effect, midline shift or abnormal extra-axial fluid collection is identified. Atherosclerosis is noted. Small right ICA aneurysm measuring approximately 0.7 cm is unchanged. The calvarium is intact.  IMPRESSION: No acute abnormality.  Mild atrophy and chronic microvascular ischemic change.  Mild aneurysm right internal carotid artery, unchanged.   Electronically Signed   By: Drusilla Kannerhomas  Dalessio M.D.   On: 11/22/2014 20:32     EKG Interpretation None      MDM   Final diagnoses:  Fall  Facial laceration, initial encounter    Patient with fall and laceration to right side of face. Closed with sutures. Removal in one week. Negative CT. Discharge home.  LACERATION REPAIR Performed by: Billee CashingPICKERING,Daemion Mcniel R. Authorized by: Billee CashingPICKERING,Alona Danford R. Consent: Verbal consent obtained. Risks and benefits: risks, benefits and alternatives were discussed Consent given by: patient Patient identity confirmed: provided demographic data Prepped and Draped in normal sterile fashion Wound explored  Laceration Location: right face  Laceration Length: 2cm  No Foreign Bodies seen or palpated  Anesthesia: local infiltration  Local anesthetic: lidocaine 1%   Anesthetic total: 2 ml  Irrigation method: syringe Amount of cleaning: standard  Skin closure: 5-0 vicryl rapide  Number of sutures:  4  Technique: simple interupted  Patient tolerance: Patient tolerated the procedure well with no immediate complications.    Juliet RudeNathan R. Rubin PayorPickering, MD 11/22/14 2153

## 2014-11-22 NOTE — ED Notes (Signed)
1" laceration in front of her rt ear.  She stumbled and fell coming from the br earlier today.  No loc no other injuries.  Bleeding is controlled.  Alert oriented pleasant no pain

## 2014-12-01 ENCOUNTER — Ambulatory Visit (INDEPENDENT_AMBULATORY_CARE_PROVIDER_SITE_OTHER): Payer: 59 | Admitting: *Deleted

## 2014-12-01 DIAGNOSIS — Z4802 Encounter for removal of sutures: Secondary | ICD-10-CM

## 2014-12-01 NOTE — Progress Notes (Signed)
   Pt in nurse clinic for suture removal.  Sutures placed at ED visit 11/22/2014 on right side of face next to ear.  Four sutures intact, 4 sutures removed without difficulty.  Pt advised to follow up with PCP.  Clovis PuMartin, Laderrick Wilk L, RN

## 2014-12-10 ENCOUNTER — Ambulatory Visit: Payer: PRIVATE HEALTH INSURANCE | Admitting: Family Medicine

## 2014-12-15 ENCOUNTER — Ambulatory Visit: Payer: 59 | Admitting: Family Medicine

## 2014-12-21 ENCOUNTER — Ambulatory Visit: Payer: 59 | Admitting: Family Medicine

## 2015-02-11 ENCOUNTER — Ambulatory Visit (INDEPENDENT_AMBULATORY_CARE_PROVIDER_SITE_OTHER): Payer: Medicare Other | Admitting: Family Medicine

## 2015-02-11 ENCOUNTER — Encounter: Payer: Self-pay | Admitting: Family Medicine

## 2015-02-11 VITALS — BP 180/57 | HR 59 | Temp 98.5°F | Wt 103.6 lb

## 2015-02-11 DIAGNOSIS — H269 Unspecified cataract: Secondary | ICD-10-CM | POA: Diagnosis not present

## 2015-02-11 DIAGNOSIS — I10 Essential (primary) hypertension: Secondary | ICD-10-CM

## 2015-02-11 DIAGNOSIS — M1990 Unspecified osteoarthritis, unspecified site: Secondary | ICD-10-CM

## 2015-02-11 DIAGNOSIS — F0390 Unspecified dementia without behavioral disturbance: Secondary | ICD-10-CM | POA: Diagnosis not present

## 2015-02-11 DIAGNOSIS — R413 Other amnesia: Secondary | ICD-10-CM | POA: Diagnosis not present

## 2015-02-11 DIAGNOSIS — R42 Dizziness and giddiness: Secondary | ICD-10-CM

## 2015-02-11 LAB — BASIC METABOLIC PANEL
BUN: 14 mg/dL (ref 6–23)
CALCIUM: 9.4 mg/dL (ref 8.4–10.5)
CO2: 26 meq/L (ref 19–32)
CREATININE: 0.73 mg/dL (ref 0.50–1.10)
Chloride: 105 mEq/L (ref 96–112)
GLUCOSE: 92 mg/dL (ref 70–99)
Potassium: 4.8 mEq/L (ref 3.5–5.3)
Sodium: 141 mEq/L (ref 135–145)

## 2015-02-11 MED ORDER — AMLODIPINE BESYLATE 5 MG PO TABS: 5.0000 mg | ORAL_TABLET | Freq: Every day | ORAL | Status: DC

## 2015-02-11 MED ORDER — LISINOPRIL 10 MG PO TABS: 10.0000 mg | ORAL_TABLET | Freq: Every day | ORAL | Status: AC

## 2015-02-11 NOTE — Patient Instructions (Signed)
Nice to see you. We are going to restart your lisinopril.  I would like for you to come back in a week for a nurse visit for a blood pressure check. Please try taking tylenol 1000 mg three times a day for your arthritis.  Please schedule an appointment in geriatrics clinic for further evaluation.

## 2015-02-12 ENCOUNTER — Encounter: Payer: Self-pay | Admitting: Family Medicine

## 2015-02-12 DIAGNOSIS — R42 Dizziness and giddiness: Secondary | ICD-10-CM | POA: Insufficient documentation

## 2015-02-12 DIAGNOSIS — F0390 Unspecified dementia without behavioral disturbance: Secondary | ICD-10-CM | POA: Insufficient documentation

## 2015-02-12 MED ORDER — ACETAMINOPHEN 500 MG PO TABS: 1000.0000 mg | ORAL_TABLET | Freq: Two times a day (BID) | ORAL | Status: DC

## 2015-02-12 NOTE — Assessment & Plan Note (Addendum)
Elevated today. They report taking lisinopril, though it has been a year and a half since refills were sent in, so there are concerns that she has not been taking medication for this issue. Will refill lisinopril and start on amlodpine 5 mg. May need to increase lisinopril to 20 mg in the future. BMET today. F/u with nursing for BP check in one week. F/u with me in one month.

## 2015-02-12 NOTE — Assessment & Plan Note (Signed)
-   Referred to optho.

## 2015-02-12 NOTE — Assessment & Plan Note (Addendum)
Patient with continued discomfort from OA. I do not believe she is a great candidate for NSAIDs which she has been taking in the past given her age and BP issues. Will give trial of scheduled tylenol. If no benefit will reconsider NSAIDs as she is allergic to tramadol.

## 2015-02-12 NOTE — Assessment & Plan Note (Signed)
Patient failed mini-cog. Do not believe the phq-9 is necessarily accurate without a more accurate representation of her memory loss. Will have the patient follow-up in Geri clinic for full memory evaluation. Could be that her depression is contributing to this memory issue as well. Will need more thorough evaluation at follow-up.

## 2015-02-12 NOTE — Assessment & Plan Note (Signed)
Patient with light headedness mostly on rising from seated position. Orthostatics negative in clinic, though would not be surprised if there is some measure of autonomic insufficiency leading to the light headedness. Could also be vertigo, though she had negative dix-halpike and no spinning. Advised on rising from chair slowly as this is the most troublesome issue for her. Will continue to monitor. Could consider PT if continues to be an issue.

## 2015-02-12 NOTE — Progress Notes (Addendum)
Patient ID: Maryella ShiversDorothy E Courtright, female   DOB: 06/26/1926, 81 y.o.   MRN: [id number]  Marikay AlarEric Joseeduardo Brix, MD Phone: [phone]  Maryella ShiversDorothy E Wimberly is a 81 y.o. female who presents today for f/u.  HYPERTENSION Disease Monitoring Home BP Monitoring not checking Chest pain- no    Dyspnea- no Medications Compliance-  States has been taking lisinopril. On review of MAR it appears the patient was given 11 refills in September 2014 with no apparent refills since that time Lightheadedness-  Yes, see below  Edema- no  Light headedness: patient notes this occurs on going from sitting to standing position. Also with any change in position. No vertigo. No palpitations, chest pain, or dyspnea with this. This has been a recurrent issue in the past and patient previously diagnosed with orthostasis.  Arthritis: notes she has arthritis all over. Mostly in her knees with pain made worse by walking. Has been taking naproxen and arthrotec with mild benefit.  Patient also notes depression. On asking her about this she states some people just feel down some of the time. Has never been treated for this. No SI or HI. No alcohol, drugs, or smoking. Of note patient has a very difficult time providing history and laughs when she is unable to remember things. Mini cog performed and failed.  PHQ9 10  Patient is a nonsmoker.   ROS: Per HPI   Physical Exam Filed Vitals:   02/11/15 1532  BP: 180/57  Pulse: 59  Temp: 98.5 F (36.9 C)    Gen: Well NAD HEENT: PERRL,  MMM, cataracts evident on eye exam, unable to appreciate retina on fundus exam Lungs: CTABL Nl WOB Heart: RRR  MSK: bilateral knees with no swelling or tenderness, no ligamentous laxity, negative mcmurray Exts: Non edematous BL  LE, warm and well perfused.  Negative dix-halpike   Assessment/Plan: Please see individual problem list.  Marikay AlarEric Londin Antone, MD [facility] PGY[facility]

## 2015-02-22 ENCOUNTER — Ambulatory Visit (INDEPENDENT_AMBULATORY_CARE_PROVIDER_SITE_OTHER): Payer: Medicare Other | Admitting: *Deleted

## 2015-02-22 VITALS — BP 130/60

## 2015-02-22 DIAGNOSIS — Z136 Encounter for screening for cardiovascular disorders: Secondary | ICD-10-CM

## 2015-02-22 DIAGNOSIS — Z0130 Encounter for examination of blood pressure without abnormal findings: Secondary | ICD-10-CM

## 2015-02-22 NOTE — Progress Notes (Signed)
   Pt in nurse clinic for blood pressure check.  BP 130/60 manually.  Pt's granddaughter Rayfield CitizenCaroline stated pt was told to take calcium supplements.  She was unsure if the provider wanted her to purchase the supplements over the counter or have a prescription sent in.  Please give granddaughter a call regarding the calcium at [phone].  Will forward to PCP.  Clovis PuMartin, Trevonne Nyland L, RN

## 2015-03-08 ENCOUNTER — Other Ambulatory Visit: Payer: Self-pay | Admitting: Family Medicine

## 2015-03-08 MED ORDER — CALCIUM CARBONATE-VITAMIN D 600-400 MG-UNIT PO TABS: 1.0000 | ORAL_TABLET | Freq: Two times a day (BID) | ORAL | Status: AC

## 2015-03-08 NOTE — Progress Notes (Signed)
Prescription for calcium and vitamin D supplement sent to pharmacy for the patient. Please inform the patients granddaughter. Thanks.

## 2015-03-08 NOTE — Addendum Note (Signed)
Addended by: Glori LuisSONNENBERG, Hildred Pharo G on: 03/08/2015 05:39 PM   Modules accepted: Level of Service

## 2015-03-25 ENCOUNTER — Ambulatory Visit (INDEPENDENT_AMBULATORY_CARE_PROVIDER_SITE_OTHER): Payer: Medicare Other | Admitting: Family Medicine

## 2015-03-25 ENCOUNTER — Encounter: Payer: Self-pay | Admitting: Family Medicine

## 2015-03-25 VITALS — Ht 59.0 in | Wt 104.0 lb

## 2015-03-25 DIAGNOSIS — F0280 Dementia in other diseases classified elsewhere without behavioral disturbance: Secondary | ICD-10-CM

## 2015-03-25 DIAGNOSIS — F0390 Unspecified dementia without behavioral disturbance: Secondary | ICD-10-CM | POA: Diagnosis not present

## 2015-03-25 DIAGNOSIS — Z9181 History of falling: Secondary | ICD-10-CM | POA: Insufficient documentation

## 2015-03-25 DIAGNOSIS — F329 Major depressive disorder, single episode, unspecified: Secondary | ICD-10-CM

## 2015-03-25 DIAGNOSIS — R413 Other amnesia: Secondary | ICD-10-CM

## 2015-03-25 DIAGNOSIS — R2681 Unsteadiness on feet: Secondary | ICD-10-CM | POA: Insufficient documentation

## 2015-03-25 DIAGNOSIS — F0393 Unspecified dementia, unspecified severity, with mood disturbance: Secondary | ICD-10-CM | POA: Insufficient documentation

## 2015-03-25 DIAGNOSIS — M810 Age-related osteoporosis without current pathological fracture: Secondary | ICD-10-CM | POA: Insufficient documentation

## 2015-03-25 DIAGNOSIS — R296 Repeated falls: Secondary | ICD-10-CM | POA: Diagnosis not present

## 2015-03-25 LAB — VITAMIN B12: Vitamin B-12: 230 pg/mL (ref 211–911)

## 2015-03-25 LAB — FOLATE

## 2015-03-25 LAB — TSH: TSH: 3.205 u[IU]/mL (ref 0.350–4.500)

## 2015-03-25 NOTE — Progress Notes (Signed)
[facility]:   Patient is accompanied by: granddaughter  Primary caregiver: granddaughter/grandson's wife  History obtained from: Indiaarla and Eber JonesCarolyn (above)  Primary Care Provider: Dr. Birdie SonsSonnenberg Referring provider: Dr. Birdie SonsSonnenberg Reason for referral: Cognitive assessment/Geri assessment  History Chief Complaint  Patient presents with  . geriatric assesment    HPI by problems:  1) Memory Decline - Meets criteria for dementia w/o behavioral disorder, moderate stage.  Ongoing slowly for the past several years per her caregiver's report.  Over the past year, she was moved from self care in her house to split care between granddaughter and grandson's house.  Pt is at home by herself during the day at times but is able to utilize the restroom and ambulate with a cane without problems.    2) Fall risk, high.  Hx of fall - Pt had fall transferring from bathroom shower to the floor once in the past 6 months.  Otherwise, she is unsteady on her feet or ascending/descending stairs.  Does have a cane at home but denies recent injury.    Outpatient Encounter Prescriptions as of 03/25/2015  Medication Sig  . acetaminophen (TYLENOL) 500 MG tablet Take 2 tablets (1,000 mg total) by mouth every 12 (twelve) hours.  Marland Kitchen. amLODipine (NORVASC) 5 MG tablet Take 1 tablet (5 mg total) by mouth daily.  Marland Kitchen. aspirin EC 81 MG tablet Take 81 mg by mouth daily.  . Calcium Carbonate-Vitamin D (CALCIUM 600-D) 600-400 MG-UNIT per tablet Take 1 tablet by mouth 2 (two) times daily.  . fluticasone (FLONASE) 50 MCG/ACT nasal spray Place 1 spray into the nose daily.  Marland Kitchen. lisinopril (PRINIVIL,ZESTRIL) 10 MG tablet Take 1 tablet (10 mg total) by mouth daily.  Marland Kitchen. omeprazole (PRILOSEC) 20 MG capsule Take 1 capsule (20 mg total) by mouth daily.   History Patient Active Problem List   Diagnosis Date Noted  . Osteoporosis 03/25/2015  . Light-headed feeling 02/12/2015  . Leg cramps 04/23/2012  . Abnormal  endometrial ultrasound 04/08/2012  . Lumbar disc disease 03/08/2012  . Aortic insufficiency 09/22/2011  . Falls frequently 04/26/2011  . Cataracts, bilateral 04/18/2011  . CHRONIC KIDNEY DISEASE STAGE III (MODERATE) 03/04/2010  . VISUAL ACUITY, DECREASED 08/18/2009  . CONSTIPATION, CHRONIC 08/18/2009  . Memory loss 08/17/2008  . HYPERTENSION, BENIGN 04/01/2008  . RHINITIS, ALLERGIC 01/24/2007  . Osteoarthritis 01/24/2007  . OSTEOPOROSIS, UNSPECIFIED 01/24/2007   Past Medical History  Diagnosis Date  . Depression   . Fracture of arm 2000    right  . Hypertension   . Osteoporosis   . Brain aneurysm    No past surgical history on file. Family History  Problem Relation Age of Onset  . Diabetes Mother   . Cancer Mother   . Goiter Mother   . Stroke Father   . Cancer Sister 3060    breast  . Cancer Brother     skin   History   Social History  . Marital Status: Widowed    Spouse Name: N/A  . Number of Children: 3  . Years of Education: 8   Occupational History  . Retired- Occupational psychologistbiscuit maker    Social History Main Topics  . Smoking status: Never Smoker   . Smokeless tobacco: Never Used  . Alcohol Use: No  . Drug Use: No  . Sexual Activity: Not on file   Other Topics Concern  . None   Social History Narrative   Health Care POA:    Emergency Contact: Daughter, Zella BallRobin, [phone]  End of Life Plan:    Who lives with you: Lives with Son (danny) in 1 story home   Any pets: 1 dog- Baby   Diet: Patient has a varied diet but lacks fruits, vegetables, and whole grains.  Does not control shopping or food in the house.    Exercise: No regular exercise plan   Seatbelts: Patient reports wearing seatbelt when in vehicles.   Sun Exposure/Protection: Patient does not wear sun screen.   Hobbies: reading             Basic Activities of Daily Living   eating, bathing, toileting, personal cares, ambulating, grooming, hygiene, dressing upper body, dressing lower body, meal  preparation and taking own medications  Needs help with all the above   Caregivers in home: Granddaughter/grandson and grandson's wife  Caregiver Burdens: None    Falls in the past six months:   Yes.  1 time when transffering from shower to bathroom   Health Maintenance reviewed: Immunization History  Administered Date(s) Administered  . Influenza Split 08/15/2011, 09/02/2012  . Influenza Whole 08/17/2008  . Influenza,inj,Quad PF,36+ Mos 08/14/2013  . Pneumococcal Polysaccharide-23 04/28/2003  . Td 04/28/2003   Health Maintenance Topics with due status: Overdue     Topic Date Due   ZOSTAVAX 07/03/1988   DEXA SCAN 07/03/1993   PNA vac Low Risk Adult 04/27/2004   TETANUS/TDAP 04/27/2013    Diet: Regular Nutritional supplements: None   ROS Denies fevers/chills; denies changes in appetite; denies changes in weight;  Denies changes in vision / hearing / smell / taste; Denies runny nose / ear pain or discharge / sore throat / sinus congestion / cough/w phlegm; Denies chest congestion / wheezing;  Denies chest pain; denies heart beating slower/thumps inside chest; denies racing heart/flutter; Denies dysuria; denies hematuria;  Denies constipation; denies melena/hematochezia; denies diarrhea;  Denies abdominal discomfort/gaseous bloating; denies N/V; denies heart burn;  + recent falls/unsteady gait;  Denies unilateral weakness / clumsiness / tingling / numbness; denies tremors;  + for sadness / Negative for anxiety / suicidal tendencies  Vital Signs Weight: 104 lb (47.174 kg) Body mass index is 20.99 kg/(m^2). CrCl cannot be calculated (Patient has no serum creatinine result on file.). Body surface area is 1.40 meters squared. Filed Vitals:   03/25/15 1542  Height: 4\' 11"  (1.499 m)  Weight: 104 lb (47.174 kg)   Wt Readings from Last 3 Encounters:  03/25/15 104 lb (47.174 kg)  02/11/15 103 lb 9 oz (46.976 kg)  06/02/14 105 lb (47.628 kg)    Physical Examination:  VS  reviewed GEN: Alert, Cooperative, Groomed, NAD HEENT: PERRL; EAC bilaterally not occluded, TM's translucent with normal LM, (+) LR;                No cervical LAN, No thyromegaly, No palpable masses COR: RRR, No M/G/R, No JVD, Normal PMI size and location LUNGS: BCTA, No Acc mm use, speaking in full sentences ABDOMEN: (+)BS, soft, NT, ND, No HSM, No palpable masses GU: Normal Rectal tone, no palpable masses, prostate without hypertrophy/asymmetry/nodularity. Hemoccult negative. EXT: No peripheral leg edema. Feet without deformity or lesions. Palpable bilateral pedal pulses.  SKIN: No lesion nor rashes of face/trunk/extremities Neuro: Oriented to person, place, and time; Strength: 5/5 Bil. UE and LE symmetric; Sensation: Intact grossly to touch all four extremities; Cerebellar: Finger-to-Nose intact, Rhomberg negative; Muscle Tone normal; Tremor not present  Timed up and Go Test - 28 seconds 4-Stage Stand Test:  Feet Side-by-side: Yes.  Feet Semi-tandem: Yes.       Feet Tandem: No.     One-Foot Stand:  No.  DTR: Bilateral Bicep 2+, Bilateral Triceps 2+, Bilateral Knees 2+, Bilateral Ankles 1+ Gait: No significant path deviation, Step-through present  Psych: Normal affect/thought/speech/language   Mini-Mental State Examination or Montreal Cognitive Assessment:  Total Score: MMSE - Mini Mental State Exam 08/21/2013 03/07/2011  Orientation to time 5 5  Orientation to Place 5 5  Registration 3 3  Attention/ Calculation 2 5  Recall 2 2  Language- name 2 objects 2 2  Language- repeat 1 1  Language- follow 3 step command 3 3  Language- read & follow direction 1 1  Write a sentence 1 1  Copy design 1 1  Total score 26 29       No flowsheet data found.  Geriatric Depression Scale:  Positive   MOCA 10/22, correction to 15/30 for MMSE   Labs  Lab Results  Component Value Date   VITAMINB12 232 05/04/2011    No results found for: FOLATE  Lab Results  Component Value Date    TSH 3.194 09/15/2011    No results found for: RPR    Chemistry      Component Value Date/Time   NA 141 02/11/2015 1648   K 4.8 02/11/2015 1648   CL 105 02/11/2015 1648   CO2 26 02/11/2015 1648   BUN 14 02/11/2015 1648   CREATININE 0.73 02/11/2015 1648   CREATININE 0.98 06/24/2010 2035      Component Value Date/Time   CALCIUM 9.4 02/11/2015 1648   ALKPHOS 52 08/14/2013 1036   AST 18 08/14/2013 1036   ALT 10 08/14/2013 1036   BILITOT 0.4 08/14/2013 1036       No results found for: HGBA1C    Lab Results  Component Value Date   WBC 5.3 08/14/2013   HGB 11.9* 08/14/2013   HCT 34.2* 08/14/2013   MCV 94.0 08/14/2013   PLT 253 08/14/2013    No results found for this or any previous visit (from the past 24 hour(s)).  Assessment and Plan: Problem List Items Addressed This Visit    At high risk for falls   Dementia - Primary    Pt meets criteria for dementia with 10/22 on MOCA and correction to 15/30 on MMSE - Will obtain screening labs including TSH, RPR, B12, Folate.  Head CT obtained in December 2015 showing microvascular changes and mild white matter atrophy - Could benefit from adult day care/senior center if caregivers are working - Up to family whether a trial of Aricept +- Namenda would be appropriate       Relevant Orders   Vitamin B12   Folate   TSH   RPR   Depression due to dementia    + for Geriatric depression scale today. - Consider trial of SSRI with repeat assessment       Memory loss   Relevant Orders   Vitamin B12   Folate   TSH   RPR   Unsteady gait    Pt with very high risk for falls including 28 seconds on TUG test and 4 stance balance testing - Referral to home health for Physical therapy to increase her balance/gait      Relevant Orders   Home Health   Face-to-face encounter (required for Medicare/Medicaid patients)       Advanced Directives: Code Status:  DNR (Pt's wishes and understands the nature of this)   No flowsheet  data  found.Contact: First and Last Name if other than the patient involved: Fanny Bien   [phone] (home)    Patient to Follow up with Dr. Birdie Sons in 1 month(s)

## 2015-03-25 NOTE — Assessment & Plan Note (Signed)
+   for Geriatric depression scale today. - Consider trial of SSRI with repeat assessment

## 2015-03-25 NOTE — Assessment & Plan Note (Signed)
Pt meets criteria for dementia with 10/22 on MOCA and correction to 15/30 on MMSE - Will obtain screening labs including TSH, RPR, B12, Folate.  Head CT obtained in December 2015 showing microvascular changes and mild white matter atrophy - Could benefit from adult day care/senior center if caregivers are working - Up to family whether a trial of Aricept +- Namenda would be appropriate

## 2015-03-25 NOTE — Assessment & Plan Note (Signed)
Pt with very high risk for falls including 28 seconds on TUG test and 4 stance balance testing - Referral to home health for Physical therapy to increase her balance/gait

## 2015-03-26 ENCOUNTER — Encounter: Payer: Self-pay | Admitting: Family Medicine

## 2015-03-26 LAB — RPR

## 2015-03-26 NOTE — Progress Notes (Signed)
Patient ID: Maryella ShiversDorothy E Inks, female   DOB: 06/27/1926, 81 y.o.   MRN: [id number]  A/P  1. Dementia, probable Alzheimer's type New diagnosis for patient.  Head CT 10/2014 without evidence of stroke. No history of prior strokes.  FAST Stage: 4 Palliative Performance Score: 60%.  Progression since last assessment: No prior assessments Behavioral and Psychological Symptoms of Dementia: No  Counseled patient and family regarding the diagnosis of dementia and progression. Provided packet of materials assembled for patients and families with more information about dementia and resources to assist with coping. All questions answered fully. Emphasized Alzheimer Association as a particularly good resource for help.  Recommended Family look into Adult Daycare Centers where Ms Harrison MonsSwink could stay during the day receiving socialization, her medications, meals / snacks, and activities.   2. Balance Impairment and Reduced Gait speed  - At risk for falls.  Had a fall in December for which she was evaluated in ED. -  Family agreed to try Home health PT for Balance and Gait training as well as fitting patient for cane or rolling walker.   Also requested a home safety evaluation from home health.   3. Impaired vision - Patient has an appointment with ophthalmology arranged - Correctition of vision, if possible, will improve QOL and decrease fall risk   4. Hearing loss bilateral - Sweep Audiogram shows spotty hearing frequencies preserved with mostly profound loss in most audio frequencies.    Office Visit was for 60 minutes with over 20 minutes spent in counseling family about diagnosis and natural history

## 2015-03-30 ENCOUNTER — Encounter: Payer: Self-pay | Admitting: Clinical

## 2015-03-30 NOTE — Progress Notes (Signed)
[facility]  Theresia BoughNorma Zaedyn Covin, MSW, LCSW [phone]

## 2015-03-31 NOTE — Progress Notes (Signed)
Per [facility], "If the family contacts you, please have them call us at [phone]. We are having a hard time getting in touch with anyone."  Theresia BoughNorma Markail Diekman, MSW, LCSW [phone]

## 2015-04-01 ENCOUNTER — Encounter: Payer: Self-pay | Admitting: Family Medicine

## 2015-04-06 ENCOUNTER — Encounter: Payer: Self-pay | Admitting: Clinical

## 2015-04-06 NOTE — Progress Notes (Signed)
CSW informed by [facility] that pts daughter Zella BallRobin [phone]) has been unreachable. Services for therapy have not been started because of inability to reach caregiver.  Theresia BoughNorma Jonpaul Lumm, MSW, LCSW [phone]

## 2015-05-27 ENCOUNTER — Ambulatory Visit: Payer: Medicare Other | Admitting: Family Medicine

## 2015-06-07 ENCOUNTER — Ambulatory Visit (INDEPENDENT_AMBULATORY_CARE_PROVIDER_SITE_OTHER): Payer: Medicare Other | Admitting: Internal Medicine

## 2015-06-07 ENCOUNTER — Encounter: Payer: Self-pay | Admitting: Internal Medicine

## 2015-06-07 VITALS — BP 134/50 | HR 80 | Temp 98.1°F | Wt 102.5 lb

## 2015-06-07 DIAGNOSIS — I10 Essential (primary) hypertension: Secondary | ICD-10-CM | POA: Diagnosis not present

## 2015-06-07 MED ORDER — AMLODIPINE BESYLATE 5 MG PO TABS: 5.0000 mg | ORAL_TABLET | Freq: Every day | ORAL | Status: DC

## 2015-06-07 NOTE — Progress Notes (Signed)
Patient ID: Nancy Park, female   DOB: 10/06/1926, 81 y.o.   MRN: [id number]  Subjective:   CC: med refill, neckpain, abdominal pain. Patient is here with her grandson's wife. She helped provide most of the history   HPI:   HTN: Patient takes Norvasc 5mg  and Lisinopril 10mg  daily. Does not monitor BP at home. Denies orthostatic symptoms, chest pain, SOB, HA/vision changes, or LE swelling.   Neck Pain: Patient complains of intermittent pain that is chronic. She is not able to characterize it. Pain not worsening and does not radiate. Denies tingling/numbness of upper extremities. No aggravating factors. Has been taking Tylenol 500mg  2 tabs in AM and 2 tabs in PM. Per grandson's wife, tylenol does not seem to work.   Abdominal Pain: Notes of intermittent generalized abdominal pain which has been chronic. Symptoms have not been worsening. Patient cannot characterize pain. Cannot recall aggravating factors/alleviating factors. No radiation of pain. Patient notes of feeling constipated, however her last BM was yesterday. Patient's grandson's wife notes that patient take Miralax BID and usually has 2 BM/day. Patient has taken Prilosec in the past, however granddaughter does not think this helped.   Granddaughter also inquired about obtaining information about assisted living. Granddaughter is considering assisted living for safety purposes as patient is at home alone during the day because grandson and wife go to work. Patient currently lives with grandson and his wife.   Review of Systems - Per HPI.  PMH  Reviewed      Objective:  Physical Exam BP 134/50 mmHg  Pulse 80  Temp(Src) 98.1 F (36.7 C)  Wt 102 lb 8 oz (46.494 kg) GEN: NAD CV: RRR, diastolic murmur 2/6 best heard on R upper sternal border (known aortic valve insufficiency) PULM: CTAB, normal effort ABD: Soft, nontender, nondistended, NABS, no organomegaly SKIN: No rash or cyanosis; warm and well-perfused EXTR: No lower  extremity edema or calf tenderness PSYCH: Mood and affect euthymic, normal rate and volume of speech NEURO: Awake, alert, no focal deficits grossly, normal speech MUSCULOSKELETAL: no spinal tenderness. No paraspinal tenderness. Neck: slightly decreased ROM on lateral rotation to the left (due to pain/discomfort on left side of neck ).     Assessment:     Nancy Park is a 81 y.o. female with h/o HTN, aortic valve insufficiency, Dementia, unsteady gait.     Plan:     # See problem list and after visit summary for problem-specific plans.  Neck pain: Likely muscle strain - recommended applying aspercream 3-4 times a day or Bengay  - continue Tylenol if it helps relieve some symptoms   Abdominal Pain:  Chronic. Unclear in origin. Symptoms are vague. Abdominal exam was unremarkable.  - will monitor symptoms for now  Health Maintenance:  - Patient's grandson's wife interested in assisted living options for patient. Will inform Nelva Bushorma (clinic SW) about this.  Phone numbers to reach family: Zella BallRobin  [phone]; Verlon SettingChris Whitmire (grandson) [phone]; Sonda PrimesCaroline Whitmire [phone] - gave Healthcare Power of Attorney packet to take home. Patient currently does not have POA.   Follow-up: Follow up as needed    Palma HolterKanishka G Murriel Holwerda, MD PGY1 [facility]

## 2015-06-07 NOTE — Patient Instructions (Signed)
Thank you for coming in today.   Your blood pressure was initially elevated but came down. We decided to keep your medications as is and monitor your blood pressure at home and call the clinic with the ranges in about a wee. Your neck and shoulder pain seems to be consistent with muscle strain. Try over the counter Aspercreame (apply 3-4 times a day) or Bengay for the pain. You can also continue Tylenol if it gives you any relief. I will refill your Norvasc.  Nelva BushNorma, our clinic social worker will call about assisted living options/information.

## 2015-06-07 NOTE — Assessment & Plan Note (Signed)
Initial SBP at 158 with repeat SBP at 130. Patient denies orthostatic symptoms today. Will keep current doses of medications (norvasc and lisinopril). Patient's grandson's wife will monitor BP at home and call back in 1 week with ranges.

## 2015-06-08 ENCOUNTER — Telehealth: Payer: Self-pay | Admitting: Clinical

## 2015-06-08 NOTE — Telephone Encounter (Signed)
CSW received a referral to give granddaughter-in law information on ALF. CSW LVM.  Theresia BoughNorma Sunjai Levandoski, MSW, LCSW [phone]

## 2015-06-09 ENCOUNTER — Telehealth: Payer: Self-pay | Admitting: Clinical

## 2015-06-09 NOTE — Telephone Encounter (Signed)
CSW attempted to reach family member Zella BallRobin in order to provide resources regarding ALF. CSW LVM. Theresia BoughNorma Xiadani Damman, MSW, LCSW [phone]

## 2015-06-16 ENCOUNTER — Telehealth: Payer: Self-pay | Admitting: Clinical

## 2015-06-16 NOTE — Telephone Encounter (Signed)
CSW received a call from pts daughter Nancy Park to discuss ALF placement. Nancy Park states that pt lives with her grandson and daughter in law however the family is considering ALF placement. CSW provided education on the process regarding availability, Medicaid and an FL2 form being completed. CSW offered to [place] begin calling/visiting. Nancy Park is agreeable to explore options and will contact CSW once she has found a facility.  Nancy Park, MSW, LCSW [phone]

## 2015-06-22 ENCOUNTER — Telehealth: Payer: Self-pay | Admitting: Internal Medicine

## 2015-06-22 NOTE — Telephone Encounter (Signed)
Pt's granddaughter is calling to given BP readings for the following dates:    06/08/2015:  141/61  06/09/2015:  138/79  06/10/2015:  112/50  06/11/2015:  107/62  06/12/2015:  131/52  06/13/2015:    92/41  06/14/2015:  128/60  06/15/2015:  105/57  06/16/2015:  138/60  06/21/2015:  116/56  Sadie Reynolds, ASA

## 2015-06-22 NOTE — Telephone Encounter (Signed)
Will forward to MD to make her aware.  Jazmin Hartsell,CMA  

## 2015-06-23 ENCOUNTER — Telehealth: Payer: Self-pay | Admitting: Internal Medicine

## 2015-06-23 NOTE — Telephone Encounter (Signed)
Episode on Monday night 06/21/15; procedure tomorrow to have cataracts removed; low BP; hasn't taken BP medicine in three days. Nancy Park, ASA

## 2015-06-23 NOTE — Telephone Encounter (Signed)
No response needed. Nancy Park, ASA

## 2015-06-23 NOTE — Telephone Encounter (Signed)
Spoke with patient's daughter regarding an episode patient had on Monday night.  Per daughter patient was eating dinner with grandson, patient all of sudden started shaking, eyes closed and food spilled all over the placed.  Grandson watched for a few minutes and shuck patient, she opened her eyes.  This was the first time this happen.  Patient's daughter didn't know if patient was having a stroke or seizure.  She denied any slurred speech, patient blood pressures has been running very low.  Patient has not take any blood pressure medications in the past three days.  Advised her to call the clinic where patient was going to have the cataract surgery and discuss the situation with them.  They will determine if they need to proceed.  However, patient should have a follow up appointment here at [facility] to discuss the episode and blood pressure. Will forward to PCP to give the daughter a call.  Clovis Pu, RN

## 2015-06-24 NOTE — Telephone Encounter (Signed)
I spoke with Nancy Park on the phone today. She was home alone so I was not able to talk to any of her family members. She states she is doing well. She says she had an episode on Monday night where she was shaking. She has not had this problem since then. Her cataract surgery was cancelled until she can be seen here. She does not know what her blood pressures have been running, but states that her nephew's wife has been checking her blood pressures regularly. She has an appointment scheduled here for the beginning of next week to follow-up about the shaking episode and her blood pressures. She will have her family members call our office with any additional questions.

## 2015-06-29 ENCOUNTER — Telehealth: Payer: Self-pay | Admitting: Clinical

## 2015-06-29 ENCOUNTER — Encounter: Payer: Self-pay | Admitting: Student

## 2015-06-29 ENCOUNTER — Ambulatory Visit (INDEPENDENT_AMBULATORY_CARE_PROVIDER_SITE_OTHER): Payer: Medicare Other | Admitting: Student

## 2015-06-29 VITALS — BP 135/39 | HR 59 | Temp 98.6°F | Ht 59.0 in | Wt 102.4 lb

## 2015-06-29 DIAGNOSIS — R569 Unspecified convulsions: Secondary | ICD-10-CM | POA: Diagnosis not present

## 2015-06-29 LAB — POCT URINALYSIS DIPSTICK
Bilirubin, UA: NEGATIVE
Glucose, UA: NEGATIVE
KETONES UA: NEGATIVE
NITRITE UA: NEGATIVE
PROTEIN UA: NEGATIVE
RBC UA: NEGATIVE
Spec Grav, UA: 1.02
Urobilinogen, UA: 0.2
pH, UA: 6

## 2015-06-29 LAB — CBC
HCT: 33.5 % — ABNORMAL LOW (ref 36.0–46.0)
Hemoglobin: 11 g/dL — ABNORMAL LOW (ref 12.0–15.0)
MCH: 32.4 pg (ref 26.0–34.0)
MCHC: 32.8 g/dL (ref 30.0–36.0)
MCV: 98.5 fL (ref 78.0–100.0)
MPV: 10.4 fL (ref 8.6–12.4)
Platelets: 302 10*3/uL (ref 150–400)
RBC: 3.4 MIL/uL — ABNORMAL LOW (ref 3.87–5.11)
RDW: 13.1 % (ref 11.5–15.5)
WBC: 6.1 10*3/uL (ref 4.0–10.5)

## 2015-06-29 LAB — COMPREHENSIVE METABOLIC PANEL
ALK PHOS: 38 U/L (ref 33–130)
ALT: 11 U/L (ref 6–29)
AST: 16 U/L (ref 10–35)
Albumin: 4.2 g/dL (ref 3.6–5.1)
BILIRUBIN TOTAL: 0.4 mg/dL (ref 0.2–1.2)
BUN: 20 mg/dL (ref 7–25)
CO2: 25 mmol/L (ref 20–31)
Calcium: 9.3 mg/dL (ref 8.6–10.4)
Chloride: 106 mmol/L (ref 98–110)
Creat: 0.84 mg/dL (ref 0.60–0.88)
GLUCOSE: 79 mg/dL (ref 65–99)
Potassium: 4.6 mmol/L (ref 3.5–5.3)
SODIUM: 142 mmol/L (ref 135–146)
Total Protein: 6.4 g/dL (ref 6.1–8.1)

## 2015-06-29 LAB — POCT UA - MICROSCOPIC ONLY

## 2015-06-29 MED ORDER — DOCUSATE SODIUM 100 MG PO CAPS: 100.0000 mg | ORAL_CAPSULE | Freq: Every day | ORAL | Status: AC

## 2015-06-29 NOTE — Telephone Encounter (Signed)
CSW was unable to meet with pts daughter in clinic today to answer questions pertaining to ALF placement. CSW has LVM for daughter [name].  [name], MSW, LCSW [phone]

## 2015-06-29 NOTE — Progress Notes (Signed)
Subjective:    Patient ID: Nancy Park, female    DOB: Apr 21, 1926, 81 y.o.   MRN: [id number]   CC: " Shaking spells", blood pressure  HPI  81 y/o presents for shaking spells that were witness once by Ms Stockley's grandson and grand-daughter-in-law approximately one week ago.  This episode lasted for approximately 1 minute. She was  Noted to have diffuse shaking of all extremities. She was sitting on the couch eating at that time and the plate that was on her lap fell and spilled the food that was on it. Her eyes were half masked her grand-daughter in law's report and she was mumbling to her self. She did not respond to voice and but when it was over she began to speak again normally and had no functional deficits. She was noted to have confusion for the 1-2 days previous to this event and 1 day after. She does have baseline mild dementia but she ws more confused than usual. She did not have fevers, abdominal pain, dysuria, did report increased urinary frequency. Stable lower back pain.  She reports that she had regular mammograms and pap mears which were normal, last in 2004. Has not had a colonoscopy  Lives at home with grandson. But they work during the day and she spends the day alone. Grand children are interested in SNF placement and have been working with SW    Review of Systems   See HPI for ROS.   Past medical history, surgical, family, and social history reviewed and updated in the EMR as appropriate.  Past Medical History  Diagnosis Date  . Depression   . Fracture of arm 2000    right  . Hypertension   . Osteoporosis   . Brain aneurysm    History   Social History  . Marital Status: Widowed    Spouse Name: N/A  . Number of Children: 3  . Years of Education: 8   Occupational History  . Retired- Occupational psychologist    Social History Main Topics  . Smoking status: Never Smoker   . Smokeless tobacco: Never Used  . Alcohol Use: No  . Drug Use: No  . Sexual Activity:  Not on file   Other Topics Concern  . Not on file   Social History Narrative   Health Care POA:    Emergency Contact: Daughter, [name], [phone]   End of Life Plan:    Who lives with you: Lives a granddgt   Any pets: 1 dog- Baby   Diet: Patient has a varied diet but lacks fruits, vegetables, and whole grains.  Does not control shopping or food in the house.    Exercise: No regular exercise plan   Seatbelts: Patient reports wearing seatbelt when in vehicles.   Sun Exposure/Protection: Patient does not wear sun screen.   Hobbies: reading             Objective:  BP 135/39 mmHg  Pulse 59  Temp(Src) 98.6 F (37 C) (Oral)  Ht  (1.499 m)  Wt 102 lb 6 oz (46.437 kg)  BMI 20.67 kg/m2 Vitals and nursing note reviewed  General: NAD Cardiac: RRR, Respiratory: CTAB, normal effort Abdomen: soft, nontender, nondistended, Bowel sounds present Extremities: 1+ edema or cyanosis. WWP. Skin: warm and dry, no rashes noted Neuro: alert and oriented, no focal deficits   Assessment & Plan:  See Problem List      Dyann Goodspeed A. Kennon Rounds MD, MS Family Medicine Resident PGY-1 Pager [phone]

## 2015-06-29 NOTE — Patient Instructions (Signed)
Please follow up in 1 month for concern for seizure If you have another episode of shaking please go to the emergency department immediately Please obtain the MRI as soon as possible and return after getting it Call the clinic with questions or concerns

## 2015-06-30 DIAGNOSIS — R569 Unspecified convulsions: Secondary | ICD-10-CM | POA: Insufficient documentation

## 2015-06-30 NOTE — Assessment & Plan Note (Signed)
Concern for Seizure given symptomatolgy. She had one witnessed episode but as she stays at home alone there is concern that she may have had more unwitnessed episodes given several days worth of confusion. Seizure cold be secondary to brain pathology, stroke although lower probability since she had no residual deficits after the episode, metabolic derangement. Discussed with daughter who agreed to pursue work up for prognosis.   Will obtain: - MRI - BMP  - CBC - UA to r/o UTI as infectious cause of deranganement but also low likelihood without fever - SW spoken to regarding SNF placement. They are following

## 2015-07-09 ENCOUNTER — Ambulatory Visit (HOSPITAL_COMMUNITY)
Admission: RE | Admit: 2015-07-09 | Discharge: 2015-07-09 | Disposition: A | Discharge status: Home / Self Care | Payer: Medicare Other | Source: Ambulatory Visit | Attending: Family Medicine | Admitting: Family Medicine

## 2015-07-09 DIAGNOSIS — R569 Unspecified convulsions: Secondary | ICD-10-CM | POA: Diagnosis present

## 2015-07-09 DIAGNOSIS — I671 Cerebral aneurysm, nonruptured: Secondary | ICD-10-CM | POA: Diagnosis not present

## 2015-07-09 MED ORDER — GADOBENATE DIMEGLUMINE 529 MG/ML IV SOLN
10.0000 mL | Freq: Once | INTRAVENOUS | Status: AC | PRN
  Administered 2015-07-09: 10 mL via INTRAVENOUS

## 2015-07-14 ENCOUNTER — Telehealth: Payer: Self-pay | Admitting: Clinical

## 2015-07-14 NOTE — Telephone Encounter (Signed)
CSW spoke with pts daughter [name] Radar regarding ALF/SNF placement for pt. [name] initially was interested in placement at [facility] in [place] for pt in September. CSW received another call from [facility] [place] informing CSW that she was made aware of another facility that would be closer to the family. Robin to contact CSW back with the name of the facility. CSW will contact the facility and complete an FL2 once CSW is given a name of the requested facility.  [name], MSW, LCSW [phone]

## 2015-07-15 NOTE — Telephone Encounter (Signed)
CSW received a call from pts daughter [name] today. [name] states she is interested in placement for pt at [facility]. CSW LVM for [name] at the facility to confirm that they accept pts insurance and have bed availability for September. CSW to follow up with [name] once information is received from the facility.  [name], MSW, LCSW [phone]

## 2015-07-22 ENCOUNTER — Other Ambulatory Visit: Payer: Self-pay | Admitting: Internal Medicine

## 2015-07-22 DIAGNOSIS — F0390 Unspecified dementia without behavioral disturbance: Secondary | ICD-10-CM

## 2015-07-22 NOTE — Telephone Encounter (Signed)
CSW spoke with [facility] who informed CSW that they have over a 6 month waiting list for LT care at their facility. They currently are not adding additional names to the list. CSW encouraged to call [facility], the sister company. CSW contacted Jasmine December at [facility] who informed CSW that it would be recommended for pt to receive [facility] for nursing and PT/OT evaluations. Once recommendations are made by the therapist then the facility can submit them to pts insurance for approval. CSW will discuss with PCP to see if orders for [facility] can be placed.  [name], MSW, LCSW [phone]

## 2015-07-23 ENCOUNTER — Telehealth: Payer: Self-pay | Admitting: Clinical

## 2015-07-23 NOTE — Telephone Encounter (Signed)
CSW contacted pts son Krysten Veronica to discuss home health. Reuel Boom informed CSW that pts grandson (who pt lives) father passed away yesterday therefore it would be recommended to hold off on [facility] until next week. CSW will follow up with Reuel Boom and or [name] next week to determine if its okay to send out [facility] in order to pursue SNF placement. CSW to remain following.  [name], MSW, LCSW [phone]

## 2015-08-09 ENCOUNTER — Ambulatory Visit: Payer: Medicare Other | Admitting: Internal Medicine

## 2015-08-31 ENCOUNTER — Ambulatory Visit (INDEPENDENT_AMBULATORY_CARE_PROVIDER_SITE_OTHER): Payer: Medicare Other | Admitting: Internal Medicine

## 2015-08-31 ENCOUNTER — Encounter: Payer: Self-pay | Admitting: Internal Medicine

## 2015-08-31 VITALS — BP 150/60 | HR 69 | Temp 97.7°F | Ht 59.0 in | Wt 99.6 lb

## 2015-08-31 DIAGNOSIS — M25473 Effusion, unspecified ankle: Secondary | ICD-10-CM

## 2015-08-31 DIAGNOSIS — Z23 Encounter for immunization: Secondary | ICD-10-CM | POA: Diagnosis not present

## 2015-08-31 DIAGNOSIS — R609 Edema, unspecified: Secondary | ICD-10-CM

## 2015-08-31 DIAGNOSIS — I10 Essential (primary) hypertension: Secondary | ICD-10-CM | POA: Diagnosis not present

## 2015-08-31 MED ORDER — TETANUS-DIPHTH-ACELL PERTUSSIS 5-2.5-18.5 LF-MCG/0.5 IM SUSP: 0.5000 mL | Freq: Once | INTRAMUSCULAR | Status: DC

## 2015-08-31 NOTE — Progress Notes (Signed)
Patient ID: Nancy Park, female   DOB: [birth year], 81 y.o.   MRN: [id number] Subjective:   CC: HTN follow up. Patient is here with her granddaughter in law   HPI:   HTN: Patient is taking Lisinopril  and Norvasc  as prescribed. Does not check blood pressure recently (it has been a few weeks since it was checked) but granddaughter in law notes that her BPs before ranged from 130-140s/50-60s. Also notes that she complains of feeling dizzy after standing; this is a chronic issue per granddaughter. Has not had recent falls that was witnessed, but patient is usually home alone for most of the day. Currently is working with SW with SNF placement.  Denies chest pain, SOB. Granddaughter notices some bilateral ankle swelling mainly at the end of the day.   Review of Systems - Per HPI.   PMH, FH, or SH Smoking status: never smoker     Objective:  Physical Exam BP 150/60 mmHg  Pulse 69  Temp(Src) 97.7 F (36.5 C) (Oral)  Ht  (1.499 m)  Wt 99 lb 9.6 oz (45.178 kg)  BMI 20.11 kg/m2  SpO2 96%  Orthostatic vitals: Lying 123/50 HR 59; Sitting 116/46; Standing 118/61 HR 80 GEN: NAD, elderly female CV: RRR, distolic murmur 2/6 heard best on R upper sternal border (known aortic valve insufficiency)  PULM: CTAB, normal effort SKIN: No rash or cyanosis; warm and well-perfused EXTR: trace pitting edema up to mid shin bilaterally; no calf tenderness  NEURO: Awake, alert, no focal deficits grossly, normal speech  Assessment:     Nancy Park is a 81 y.o. female here for HTN follow up. Blood pressure is stable today. Orthostatic vitals measured with stable blood pressure but meets criteria with an increase in HR after standing.    Plan:     # See problem list and after visit summary for problem-specific plans.   # Health Maintenance: given influenza, pneumococcal 13 valent, and prescribed Tdap vaccine as patient's insurance did not cover for this vaccine in clinic.    Follow-up: Follow up in 4 weeks for BP   Palma Holter, MD [facility]

## 2015-08-31 NOTE — Patient Instructions (Addendum)
Thank you for coming in today!  We decided to discontinue Norvasc because your blood pressure readings have been on the lower end. This is to avoid feeling dizzy and avoid falls. Please check your blood pressure daily and keep a log. Please call the clinic if you have blood pressures higher than 160. I would like to see you back in 4 weeks for Blood Pressure follow up.   I sent a prescription for your tetanus vaccine to the pharmacy

## 2015-08-31 NOTE — Assessment & Plan Note (Addendum)
Blood pressure stable today. Orthostatic vitals measured with stable blood pressure but meets criteria with an increase in HR after standing (Lying 123/50 HR 59; Sitting 116/46; Standing 118/61 HR 80). Discussed with granddaughter regarding blood pressure goal for patient with her age; her blood pressures especially her orthostatic pressures are in the low end for age. . Discussed the options of continuing both Norvasc and Lisinopril and monitoring dizziness with standing and falls or discontinuing Norvasc to reduce risk of orthostatic hypotension and falls and to monitor blood pressures at home after discontinuing medication. Discontinuing Norvasc may also improve ankle edema.  - agreed to discontinue Norvasc  for now - monitor blood pressures daily at home - call clinic for SBP >160 - follow up in 4 weeks for blood pressure

## 2015-08-31 NOTE — Assessment & Plan Note (Signed)
Mild ankle edema noted today. Granddaughter notices it mainly in the evening. On exam trace edema up to mid shin bilaterally, no erythema or calf tenderness. ECHO in 2012 unremarkable. No clinical signs of heart failure such as crackles on lung exam. Norvasc is possibly contributing.  - discontinued Norvasc due to stable/low end of normal blood pressures - encouraged to elevate LE - monitor for worsening symptoms - watch salt intake - consider repeat ECHO if symptoms worsen

## 2015-09-02 ENCOUNTER — Telehealth: Payer: Self-pay | Admitting: *Deleted

## 2015-09-02 NOTE — Telephone Encounter (Signed)
Granddaughter Rayfield Citizen had left a message to have Nelva Bush CSW contact her regarding patient but she is out of the office for the next couple of months.  Called granddaughter to inform her of this and asked her to call me if I could help in any way.  Not sure what she was wanting to update norma on but we might be able to assist her with other resources while she is out. Jazmin Hartsell,CMA

## 2015-09-06 ENCOUNTER — Encounter (HOSPITAL_COMMUNITY): Payer: Self-pay | Admitting: Emergency Medicine

## 2015-09-06 ENCOUNTER — Emergency Department (HOSPITAL_COMMUNITY): Payer: Medicare Other

## 2015-09-06 ENCOUNTER — Inpatient Hospital Stay (HOSPITAL_COMMUNITY)
Admission: EM | Admit: 2015-09-06 | Discharge: 2015-09-09 | DRG: 482 | Disposition: A | Discharge status: Skilled Nursing Facility | Payer: Medicare Other | Attending: Family Medicine | Admitting: Family Medicine

## 2015-09-06 ENCOUNTER — Inpatient Hospital Stay (HOSPITAL_COMMUNITY): Payer: Medicare Other | Admitting: Anesthesiology

## 2015-09-06 ENCOUNTER — Inpatient Hospital Stay (HOSPITAL_COMMUNITY): Payer: Medicare Other

## 2015-09-06 ENCOUNTER — Encounter (HOSPITAL_COMMUNITY)
Admission: EM | Disposition: A | Discharge status: Skilled Nursing Facility | Payer: Self-pay | Source: Home / Self Care | Attending: Family Medicine

## 2015-09-06 DIAGNOSIS — M25551 Pain in right hip: Secondary | ICD-10-CM | POA: Diagnosis present

## 2015-09-06 DIAGNOSIS — I739 Peripheral vascular disease, unspecified: Secondary | ICD-10-CM | POA: Diagnosis present

## 2015-09-06 DIAGNOSIS — Z23 Encounter for immunization: Secondary | ICD-10-CM

## 2015-09-06 DIAGNOSIS — Z885 Allergy status to narcotic agent status: Secondary | ICD-10-CM | POA: Diagnosis not present

## 2015-09-06 DIAGNOSIS — Z7982 Long term (current) use of aspirin: Secondary | ICD-10-CM | POA: Diagnosis not present

## 2015-09-06 DIAGNOSIS — Z66 Do not resuscitate: Secondary | ICD-10-CM | POA: Diagnosis present

## 2015-09-06 DIAGNOSIS — F329 Major depressive disorder, single episode, unspecified: Secondary | ICD-10-CM | POA: Diagnosis present

## 2015-09-06 DIAGNOSIS — Z9181 History of falling: Secondary | ICD-10-CM

## 2015-09-06 DIAGNOSIS — S7291XA Unspecified fracture of right femur, initial encounter for closed fracture: Secondary | ICD-10-CM | POA: Insufficient documentation

## 2015-09-06 DIAGNOSIS — I10 Essential (primary) hypertension: Secondary | ICD-10-CM | POA: Insufficient documentation

## 2015-09-06 DIAGNOSIS — M1990 Unspecified osteoarthritis, unspecified site: Secondary | ICD-10-CM | POA: Diagnosis present

## 2015-09-06 DIAGNOSIS — W19XXXA Unspecified fall, initial encounter: Secondary | ICD-10-CM | POA: Diagnosis not present

## 2015-09-06 DIAGNOSIS — S7290XA Unspecified fracture of unspecified femur, initial encounter for closed fracture: Secondary | ICD-10-CM

## 2015-09-06 DIAGNOSIS — Z79899 Other long term (current) drug therapy: Secondary | ICD-10-CM

## 2015-09-06 DIAGNOSIS — N183 Chronic kidney disease, stage 3 unspecified: Secondary | ICD-10-CM | POA: Insufficient documentation

## 2015-09-06 DIAGNOSIS — M810 Age-related osteoporosis without current pathological fracture: Secondary | ICD-10-CM | POA: Diagnosis present

## 2015-09-06 DIAGNOSIS — F0390 Unspecified dementia without behavioral disturbance: Secondary | ICD-10-CM | POA: Diagnosis present

## 2015-09-06 DIAGNOSIS — W109XXA Fall (on) (from) unspecified stairs and steps, initial encounter: Secondary | ICD-10-CM | POA: Diagnosis present

## 2015-09-06 DIAGNOSIS — G3183 Dementia with Lewy bodies: Secondary | ICD-10-CM

## 2015-09-06 DIAGNOSIS — S72009A Fracture of unspecified part of neck of unspecified femur, initial encounter for closed fracture: Secondary | ICD-10-CM | POA: Diagnosis present

## 2015-09-06 DIAGNOSIS — S72141A Displaced intertrochanteric fracture of right femur, initial encounter for closed fracture: Secondary | ICD-10-CM | POA: Diagnosis present

## 2015-09-06 DIAGNOSIS — I129 Hypertensive chronic kidney disease with stage 1 through stage 4 chronic kidney disease, or unspecified chronic kidney disease: Secondary | ICD-10-CM | POA: Diagnosis present

## 2015-09-06 DIAGNOSIS — I959 Hypotension, unspecified: Secondary | ICD-10-CM | POA: Diagnosis not present

## 2015-09-06 DIAGNOSIS — F0280 Dementia in other diseases classified elsewhere without behavioral disturbance: Secondary | ICD-10-CM | POA: Insufficient documentation

## 2015-09-06 DIAGNOSIS — S72001A Fracture of unspecified part of neck of right femur, initial encounter for closed fracture: Secondary | ICD-10-CM | POA: Diagnosis present

## 2015-09-06 HISTORY — PX: INTRAMEDULLARY (IM) NAIL INTERTROCHANTERIC: SHX5875

## 2015-09-06 LAB — I-STAT CHEM 8, ED
BUN: 19 mg/dL (ref 6–20)
CALCIUM ION: 1.13 mmol/L (ref 1.13–1.30)
Chloride: 104 mmol/L (ref 101–111)
Creatinine, Ser: 0.9 mg/dL (ref 0.44–1.00)
Glucose, Bld: 108 mg/dL — ABNORMAL HIGH (ref 65–99)
HCT: 35 % — ABNORMAL LOW (ref 36.0–46.0)
HEMOGLOBIN: 11.9 g/dL — AB (ref 12.0–15.0)
Potassium: 4.3 mmol/L (ref 3.5–5.1)
SODIUM: 140 mmol/L (ref 135–145)
TCO2: 23 mmol/L (ref 0–100)

## 2015-09-06 LAB — CBC
HCT: 33.5 % — ABNORMAL LOW (ref 36.0–46.0)
Hemoglobin: 11.2 g/dL — ABNORMAL LOW (ref 12.0–15.0)
MCH: 32.9 pg (ref 26.0–34.0)
MCHC: 33.4 g/dL (ref 30.0–36.0)
MCV: 98.5 fL (ref 78.0–100.0)
PLATELETS: 237 10*3/uL (ref 150–400)
RBC: 3.4 MIL/uL — ABNORMAL LOW (ref 3.87–5.11)
RDW: 12.1 % (ref 11.5–15.5)
WBC: 9.1 10*3/uL (ref 4.0–10.5)

## 2015-09-06 LAB — CBC WITH DIFFERENTIAL/PLATELET
Basophils Absolute: 0.1 10*3/uL (ref 0.0–0.1)
Basophils Relative: 1 %
EOS ABS: 0.1 10*3/uL (ref 0.0–0.7)
EOS PCT: 2 %
HCT: 33.1 % — ABNORMAL LOW (ref 36.0–46.0)
Hemoglobin: 11 g/dL — ABNORMAL LOW (ref 12.0–15.0)
LYMPHS ABS: 1 10*3/uL (ref 0.7–4.0)
Lymphocytes Relative: 15 %
MCH: 32.7 pg (ref 26.0–34.0)
MCHC: 33.2 g/dL (ref 30.0–36.0)
MCV: 98.5 fL (ref 78.0–100.0)
MONOS PCT: 5 %
Monocytes Absolute: 0.4 10*3/uL (ref 0.1–1.0)
Neutro Abs: 5.2 10*3/uL (ref 1.7–7.7)
Neutrophils Relative %: 77 %
PLATELETS: 257 10*3/uL (ref 150–400)
RBC: 3.36 MIL/uL — ABNORMAL LOW (ref 3.87–5.11)
RDW: 12.2 % (ref 11.5–15.5)
WBC: 6.7 10*3/uL (ref 4.0–10.5)

## 2015-09-06 LAB — SURGICAL PCR SCREEN
MRSA, PCR: NEGATIVE
STAPHYLOCOCCUS AUREUS: NEGATIVE

## 2015-09-06 LAB — TYPE AND SCREEN
ABO/RH(D): A POS
ANTIBODY SCREEN: NEGATIVE

## 2015-09-06 LAB — BASIC METABOLIC PANEL
Anion gap: 8 (ref 5–15)
BUN: 15 mg/dL (ref 6–20)
CALCIUM: 9 mg/dL (ref 8.9–10.3)
CO2: 26 mmol/L (ref 22–32)
CREATININE: 0.76 mg/dL (ref 0.44–1.00)
Chloride: 104 mmol/L (ref 101–111)
GFR calc non Af Amer: >60 mL/min (ref >60)
Glucose, Bld: 116 mg/dL — ABNORMAL HIGH (ref 65–99)
Potassium: 4.4 mmol/L (ref 3.5–5.1)
SODIUM: 138 mmol/L (ref 135–145)

## 2015-09-06 LAB — ABO/RH: ABO/RH(D): A POS

## 2015-09-06 LAB — PROTIME-INR
INR: 1.06 (ref 0.00–1.49)
PROTHROMBIN TIME: 14 s (ref 11.6–15.2)

## 2015-09-06 SURGERY — FIXATION, FRACTURE, INTERTROCHANTERIC, WITH INTRAMEDULLARY ROD
Anesthesia: Monitor Anesthesia Care | Site: Leg Upper | Laterality: Right

## 2015-09-06 MED ORDER — METOCLOPRAMIDE HCL 5 MG/ML IJ SOLN: 5.0000 mg | Freq: Three times a day (TID) | INTRAMUSCULAR | Status: DC | PRN

## 2015-09-06 MED ORDER — BUPIVACAINE IN DEXTROSE 0.75-8.25 % IT SOLN
INTRATHECAL | Status: DC | PRN
  Administered 2015-09-06: 12 mg via INTRATHECAL

## 2015-09-06 MED ORDER — METOCLOPRAMIDE HCL 5 MG PO TABS: 5.0000 mg | ORAL_TABLET | Freq: Three times a day (TID) | ORAL | Status: DC | PRN

## 2015-09-06 MED ORDER — LIDOCAINE HCL (CARDIAC) 20 MG/ML IV SOLN
INTRAVENOUS | Status: AC
  Filled 2015-09-06: qty 5

## 2015-09-06 MED ORDER — HYDROMORPHONE HCL 1 MG/ML IJ SOLN
0.5000 mg | INTRAMUSCULAR | Status: DC | PRN
  Administered 2015-09-06 (×2): 0.5 mg via INTRAVENOUS
  Filled 2015-09-06 (×2): qty 1

## 2015-09-06 MED ORDER — SODIUM CHLORIDE 0.9 % IJ SOLN
INTRAMUSCULAR | Status: AC
  Filled 2015-09-06: qty 10

## 2015-09-06 MED ORDER — PROPOFOL 10 MG/ML IV BOLUS
INTRAVENOUS | Status: DC | PRN
  Administered 2015-09-06 (×6): 20 mg via INTRAVENOUS

## 2015-09-06 MED ORDER — FENTANYL CITRATE (PF) 100 MCG/2ML IJ SOLN
INTRAMUSCULAR | Status: DC | PRN
  Administered 2015-09-06: 50 ug via INTRAVENOUS

## 2015-09-06 MED ORDER — ASPIRIN EC 81 MG PO TBEC: 81.0000 mg | DELAYED_RELEASE_TABLET | Freq: Every day | ORAL | Status: DC

## 2015-09-06 MED ORDER — LIDOCAINE HCL (CARDIAC) 20 MG/ML IV SOLN
INTRAVENOUS | Status: DC | PRN
  Administered 2015-09-06: 50 mg via INTRAVENOUS

## 2015-09-06 MED ORDER — ACETAMINOPHEN 325 MG PO TABS: 650.0000 mg | ORAL_TABLET | Freq: Four times a day (QID) | ORAL | Status: DC | PRN

## 2015-09-06 MED ORDER — CEFAZOLIN SODIUM-DEXTROSE 2-3 GM-% IV SOLR: 2.0000 g | Freq: Once | INTRAVENOUS | Status: DC

## 2015-09-06 MED ORDER — ACETAMINOPHEN 160 MG/5ML PO SOLN
650.0000 mg | Freq: Four times a day (QID) | ORAL | Status: DC | PRN
  Administered 2015-09-06: 650 mg via ORAL
  Filled 2015-09-06 (×2): qty 20.3

## 2015-09-06 MED ORDER — GLYCOPYRROLATE 0.2 MG/ML IJ SOLN
INTRAMUSCULAR | Status: DC | PRN
  Administered 2015-09-06: 0.2 mg via INTRAVENOUS

## 2015-09-06 MED ORDER — MENTHOL 3 MG MT LOZG: 1.0000 | LOZENGE | OROMUCOSAL | Status: DC | PRN

## 2015-09-06 MED ORDER — SODIUM CHLORIDE 0.9 % IV SOLN
INTRAVENOUS | Status: DC
  Administered 2015-09-06 – 2015-09-07 (×2): via INTRAVENOUS

## 2015-09-06 MED ORDER — ONDANSETRON HCL 4 MG/2ML IJ SOLN: 4.0000 mg | Freq: Four times a day (QID) | INTRAMUSCULAR | Status: DC | PRN

## 2015-09-06 MED ORDER — CEFAZOLIN SODIUM-DEXTROSE 2-3 GM-% IV SOLR
2.0000 g | Freq: Four times a day (QID) | INTRAVENOUS | Status: AC
  Administered 2015-09-07 (×2): 2 g via INTRAVENOUS
  Filled 2015-09-06 (×2): qty 50

## 2015-09-06 MED ORDER — PHENOL 1.4 % MT LIQD: 1.0000 | OROMUCOSAL | Status: DC | PRN

## 2015-09-06 MED ORDER — FENTANYL CITRATE (PF) 250 MCG/5ML IJ SOLN
INTRAMUSCULAR | Status: AC
  Filled 2015-09-06: qty 5

## 2015-09-06 MED ORDER — ARTIFICIAL TEARS OP OINT
TOPICAL_OINTMENT | OPHTHALMIC | Status: AC
  Filled 2015-09-06: qty 7

## 2015-09-06 MED ORDER — MORPHINE SULFATE (PF) 2 MG/ML IV SOLN: 0.5000 mg | INTRAVENOUS | Status: DC | PRN

## 2015-09-06 MED ORDER — CEFAZOLIN SODIUM-DEXTROSE 2-3 GM-% IV SOLR
INTRAVENOUS | Status: AC
  Filled 2015-09-06: qty 50

## 2015-09-06 MED ORDER — HYDROCODONE-ACETAMINOPHEN 5-325 MG PO TABS
1.0000 | ORAL_TABLET | Freq: Four times a day (QID) | ORAL | Status: DC | PRN
Start: 2015-09-06 — End: 2015-09-07

## 2015-09-06 MED ORDER — LACTATED RINGERS IV SOLN
INTRAVENOUS | Status: DC | PRN
  Administered 2015-09-06: 20:00:00 via INTRAVENOUS

## 2015-09-06 MED ORDER — ROCURONIUM BROMIDE 50 MG/5ML IV SOLN
INTRAVENOUS | Status: AC
  Filled 2015-09-06: qty 1

## 2015-09-06 MED ORDER — ACETAMINOPHEN 650 MG RE SUPP: 650.0000 mg | Freq: Four times a day (QID) | RECTAL | Status: DC | PRN

## 2015-09-06 MED ORDER — SODIUM CHLORIDE 0.9 % IV SOLN
INTRAVENOUS | Status: AC
  Administered 2015-09-06: 15:00:00 via INTRAVENOUS

## 2015-09-06 MED ORDER — ASPIRIN EC 325 MG PO TBEC
325.0000 mg | DELAYED_RELEASE_TABLET | Freq: Every day | ORAL | Status: DC
  Administered 2015-09-07 – 2015-09-09 (×3): 325 mg via ORAL
  Filled 2015-09-06 (×3): qty 1

## 2015-09-06 MED ORDER — ONDANSETRON HCL 4 MG/2ML IJ SOLN
INTRAMUSCULAR | Status: AC
  Filled 2015-09-06: qty 2

## 2015-09-06 MED ORDER — DOCUSATE SODIUM 100 MG PO CAPS
100.0000 mg | ORAL_CAPSULE | Freq: Every day | ORAL | Status: DC
  Administered 2015-09-07 – 2015-09-09 (×3): 100 mg via ORAL
  Filled 2015-09-06 (×3): qty 1

## 2015-09-06 MED ORDER — MORPHINE SULFATE (PF) 2 MG/ML IV SOLN
1.0000 mg | INTRAVENOUS | Status: DC | PRN
  Administered 2015-09-06: 1 mg via INTRAVENOUS
  Filled 2015-09-06: qty 1

## 2015-09-06 MED ORDER — METHOCARBAMOL 1000 MG/10ML IJ SOLN: 500.0000 mg | Freq: Four times a day (QID) | INTRAVENOUS | Status: DC | PRN

## 2015-09-06 MED ORDER — ONDANSETRON HCL 4 MG PO TABS: 4.0000 mg | ORAL_TABLET | Freq: Four times a day (QID) | ORAL | Status: DC | PRN

## 2015-09-06 MED ORDER — PHENYLEPHRINE HCL 10 MG/ML IJ SOLN
10.0000 mg | INTRAVENOUS | Status: DC | PRN
  Administered 2015-09-06: 50 ug/min via INTRAVENOUS

## 2015-09-06 MED ORDER — CEFAZOLIN SODIUM-DEXTROSE 2-3 GM-% IV SOLR
INTRAVENOUS | Status: DC | PRN
  Administered 2015-09-06: 2 g via INTRAVENOUS

## 2015-09-06 SURGICAL SUPPLY — 43 items
BLADE SURG 15 STRL LF DISP TIS (BLADE) ×1 IMPLANT
BLADE SURG 15 STRL SS (BLADE) ×2
COVER PERINEAL POST (MISCELLANEOUS) ×3 IMPLANT
COVER SURGICAL LIGHT HANDLE (MISCELLANEOUS) ×3 IMPLANT
DRAPE C-ARM 42X72 X-RAY (DRAPES) ×3 IMPLANT
DRAPE PROXIMA HALF (DRAPES) IMPLANT
DRAPE STERI IOBAN 125X83 (DRAPES) ×3 IMPLANT
DRSG MEPILEX BORDER 4X4 (GAUZE/BANDAGES/DRESSINGS) ×3 IMPLANT
DRSG MEPILEX BORDER 4X8 (GAUZE/BANDAGES/DRESSINGS) ×3 IMPLANT
DRSG PAD ABDOMINAL 8X10 ST (GAUZE/BANDAGES/DRESSINGS) ×6 IMPLANT
DURAPREP 26ML APPLICATOR (WOUND CARE) ×3 IMPLANT
ELECT REM PT RETURN 9FT ADLT (ELECTROSURGICAL) ×3
ELECTRODE REM PT RTRN 9FT ADLT (ELECTROSURGICAL) ×1 IMPLANT
FACESHIELD WRAPAROUND (MASK) ×3 IMPLANT
GAUZE XEROFORM 5X9 LF (GAUZE/BANDAGES/DRESSINGS) ×3 IMPLANT
GLOVE BIO SURGEON STRL SZ8 (GLOVE) ×3 IMPLANT
GLOVE BIOGEL PI IND STRL 8 (GLOVE) ×1 IMPLANT
GLOVE BIOGEL PI INDICATOR 8 (GLOVE) ×2
GLOVE ORTHO TXT STRL SZ7.5 (GLOVE) ×3 IMPLANT
GOWN STRL REUS W/ TWL LRG LVL3 (GOWN DISPOSABLE) ×1 IMPLANT
GOWN STRL REUS W/ TWL XL LVL3 (GOWN DISPOSABLE) ×2 IMPLANT
GOWN STRL REUS W/TWL LRG LVL3 (GOWN DISPOSABLE) ×2
GOWN STRL REUS W/TWL XL LVL3 (GOWN DISPOSABLE) ×4
GUIDE PIN 3.2MM (MISCELLANEOUS) ×2
GUIDE PIN ORTH 343X3.2XBRAD (MISCELLANEOUS) ×1 IMPLANT
KIT BASIN OR (CUSTOM PROCEDURE TRAY) ×3 IMPLANT
KIT ROOM TURNOVER OR (KITS) ×3 IMPLANT
LINER BOOT UNIVERSAL DISP (MISCELLANEOUS) ×3 IMPLANT
MANIFOLD NEPTUNE II (INSTRUMENTS) ×3 IMPLANT
NAIL TRIGEN RT 10MMX36CM-125 (Nail) ×3 IMPLANT
NS IRRIG 1000ML POUR BTL (IV SOLUTION) ×3 IMPLANT
PACK GENERAL/GYN (CUSTOM PROCEDURE TRAY) ×3 IMPLANT
PAD ARMBOARD 7.5X6 YLW CONV (MISCELLANEOUS) ×6 IMPLANT
PAD CAST 4YDX4 CTTN HI CHSV (CAST SUPPLIES) ×2 IMPLANT
PADDING CAST COTTON 4X4 STRL (CAST SUPPLIES) ×4
SCREW LAG COMPR KIT 90/85 (Screw) ×3 IMPLANT
STAPLER VISISTAT 35W (STAPLE) ×3 IMPLANT
SUT VIC AB 0 CT1 27 (SUTURE) ×4
SUT VIC AB 0 CT1 27XBRD ANBCTR (SUTURE) ×2 IMPLANT
SUT VIC AB 2-0 CT1 27 (SUTURE) ×4
SUT VIC AB 2-0 CT1 TAPERPNT 27 (SUTURE) ×2 IMPLANT
TOWEL OR 17X24 6PK STRL BLUE (TOWEL DISPOSABLE) ×3 IMPLANT
TOWEL OR 17X26 10 PK STRL BLUE (TOWEL DISPOSABLE) ×3 IMPLANT

## 2015-09-06 NOTE — Progress Notes (Signed)
Pt arrived to unit drowsy, but oriented X4 and arousable. Dressing in place to R hip with minimal drainage noted. Denies pain. VS obtained. Will continue to monitor. Gara Kroner, RN

## 2015-09-06 NOTE — Progress Notes (Signed)
Report given Eunice Blase, RN and patient has a family member at bedside.

## 2015-09-06 NOTE — Care Management (Signed)
Utilization review completed. Francisco Ostrovsky, RN Case Manager [phone]. 

## 2015-09-06 NOTE — Progress Notes (Signed)
Prior to leaving family had questions about post op discharged. I told family that Dr. Magnus Ivan would be following up with patient's disposition. They had questions about anesthesia. I advised them that anesthesiologist would be by to answer questions about this.

## 2015-09-06 NOTE — ED Notes (Signed)
Family at bedside. 

## 2015-09-06 NOTE — ED Notes (Signed)
Pt able to void with fracture pan. Pain increased with movement

## 2015-09-06 NOTE — Anesthesia Procedure Notes (Signed)
Spinal Patient location during procedure: OR Start time: 09/06/2015 8:04 PM End time: 09/06/2015 8:14 PM Staffing Anesthesiologist: Heather Roberts Performed by: anesthesiologist  Preanesthetic Checklist Completed: patient identified, surgical consent, pre-op evaluation, timeout performed, IV checked, risks and benefits discussed and monitors and equipment checked Spinal Block Patient position: right lateral decubitus Prep: DuraPrep Patient monitoring: cardiac monitor, continuous pulse ox and blood pressure Approach: midline Location: L3-4 Injection technique: single-shot Needle Needle type: Pencan  Needle gauge: 24 G Needle length: 9 cm Additional Notes Functioning IV was confirmed and monitors were applied. Sterile prep and drape, including hand hygiene and sterile gloves were used. The patient was positioned and the spine was prepped. The skin was anesthetized with lidocaine.  Free flow of clear CSF was obtained prior to injecting local anesthetic into the CSF.  The spinal needle aspirated freely following injection.  The needle was carefully withdrawn.  The patient tolerated the procedure well.

## 2015-09-06 NOTE — H&P (Signed)
Family Medicine [facility] Admission History and Physical Service Pager: [phone]  Patient name: Nancy Park Medical record number: [id number] Date of birth: [birth year] Age: 81 y.o. Gender: female  Primary Care Provider: Palma Holter, MD Consultants: ortho Code Status: DNR/DNI  Chief Complaint: right hip fracture  Assessment and Plan: Nancy Park is a 81 y.o. female presenting with right hip fracture after fall at home. PMH is significant for hypertension & CKD-3 and osteopnea.  Right hip fracture: from mechanical fall in the setting of new environment/home. No sign of illness leading to fall. No suspeciou meds on her list to contribute to her fall. CBC and BMP within normal limit. Has hx of multiple falls, about 5 times in the last 12 months. DG femur with Impacted fracture at the base of the right femoral neck with extension to the lesser trochanter. Ortho consulted. Not on any blood thinner. -admit to FPTS. Attedning Dr. Pollie Meyer -Follow up ortho recs -NPO -Type and screen  -PT/INR -Tylenol prn for pain -Follow up EKG -place foley  Hypertension: no lisinopril 10 mg at home. Systolic BP slightly elevated. She has not taken her medication as well. No cardiac hx. Last Echo 2012 with EF of 60-65%, mild AV regurg. -EKG  -Hold home lisinopril for now  CKD-3: sCr 0.90. Stable -BMP  Osteopnea: no formal bone scan. On calcium and vitamin D supplement at home. -will hold of meds while NPO  Dementia: appears appropriate -consider formal mental status exam (MOCA)  FEN/GI:  -NPO -IVF NS 150cc/ml  Prophylaxis: -SCD  Disposition: admit to med-surg.  History of Present Illness:  Nancy Park is a 81 y.o. female presenting via EMS with right hip fracture after fall at home. Patient is awake and oriented to self, place & month but not year. She says it is 47. There is no family member at bedside to provide history. Patient reports missing a couple of  steps on her way to the bathroom and ending up on her back. She reports pain in right hip that radiates down to her knee. Per report by EMS, there was no LOC, no deformity.She denies any symptom leading to this incident. She recently moved to live with her grandson and his wife. She denies recent illness or medication changes. She used use a walker in the past but not any more. She said she didn't need it any more. She reports multiple fall in the past, about 5 in the last twelve months.  No hx of smoking or drinking. In ED: vital signs within normal range (BP slightly elevated), CBC with diff and BMP within normal range, Head and neck CT negative for acute process, DG femur with Impacted fracture at the base of the right femoral neck with extension to the lesser trochanter.  Review Of Systems: Per HPI  Otherwise 12 point review of systems was performed and was unremarkable.  Patient Active Problem List   Diagnosis Date Noted  . Hip fracture (HCC) 09/06/2015  . Ankle edema 08/31/2015  . Convulsion (HCC) 06/30/2015  . Osteoporosis 03/25/2015  . Dementia 03/25/2015  . Unsteady gait 03/25/2015  . At high risk for falls 03/25/2015  . 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 Light-headed feeling 02/12/2015  . Leg cramps 04/23/2012  . Abnormal endometrial ultrasound 04/08/2012  . Lumbar disc disease 03/08/2012  . Aortic insufficiency 09/22/2011  . Falls frequently 04/26/2011  . Cataracts, bilateral 04/18/2011  . CHRONIC KIDNEY DISEASE STAGE III (MODERATE) 03/04/2010  . VISUAL ACUITY,  DECREASED 08/18/2009  . CONSTIPATION, CHRONIC 08/18/2009  . Memory loss 08/17/2008  . HYPERTENSION, BENIGN 04/01/2008  . RHINITIS, ALLERGIC 01/24/2007  . Osteoarthritis 01/24/2007  . OSTEOPOROSIS, UNSPECIFIED 01/24/2007   Past Medical History: Past Medical History  Diagnosis Date  . Depression   . Fracture of arm 2000    right  . Hypertension   . Osteoporosis   . Brain aneurysm    Past Surgical  History: History reviewed. No pertinent past surgical history. Social History: Social History  Substance Use Topics  . Smoking status: Never Smoker   . Smokeless tobacco: Never Used  . Alcohol Use: No   Additional social history: no hx of smoking or drinking  Please also refer to relevant sections of EMR.  Family History: Family History  Problem Relation Age of Onset  . Diabetes Mother   . Cancer Mother   . Goiter Mother   . Stroke Father   . Cancer Sister 80    breast  . Cancer Brother     skin   Allergies and Medications: Allergies  Allergen Reactions  . Tramadol     REACTION: Fatigue   No current facility-administered medications on file prior to encounter.   Current Outpatient Prescriptions on File Prior to Encounter  Medication Sig Dispense Refill  . acetaminophen (TYLENOL) 500 MG tablet Take 2 tablets (1,000 mg total) by mouth every 12 (twelve) hours. 90 tablet 3  . aspirin EC 81 MG tablet Take 81 mg by mouth daily.    . Calcium Carbonate-Vitamin D (CALCIUM 600-D) 600-400 MG-UNIT per tablet Take 1 tablet by mouth 2 (two) times daily. 60 tablet 3  . docusate sodium (COLACE) 100 MG capsule Take 1 capsule (100 mg total) by mouth daily. 30 capsule 5  . fluticasone (FLONASE) 50 MCG/ACT nasal spray Place 1 spray into the nose daily. 16 g 11  . lisinopril (PRINIVIL,ZESTRIL) 10 MG tablet Take 1 tablet (10 mg total) by mouth daily. 30 tablet 11  . Tdap (BOOSTRIX) 5-2.5-18.5 LF-MCG/0.5 injection Inject 0.5 mLs into the muscle once. 0.5 mL 0    Objective: BP 163/43 mmHg  Pulse 63  Temp(Src) 98.1 F (36.7 C) (Oral)  Resp 15  Ht 4\' 11"  (1.499 m)  Wt 100 lb (45.36 kg)  BMI 20.19 kg/m2  SpO2 97% Exam: Gen: NAD, frial-appearing  CV: RRR. S1 & S2 audible, 1/6 systolic murmurs over RUSB, rubs. Chest: no tenderness to palpation Resp: no apparent WOB, CTAB. Abd: +BS. Soft, NDNT, no rebound or guarding.  Ext: appears symmetric, no edema or gross deformities. Neuro: Alert,  oriented to self, place & month. Not able to tell year. No gross focal deficits  Labs and Imaging: CBC BMET   Recent Labs Lab 09/06/15 0534 09/06/15 0539  WBC 6.7  --   HGB 11.0* 11.9*  HCT 33.1* 35.0*  PLT 257  --     Recent Labs Lab 09/06/15 0539  NA 140  K 4.3  CL 104  BUN 19  CREATININE 0.90  GLUCOSE 108*    Dg Chest 1 View  09/06/2015   CLINICAL DATA:  Larey Seat down steps tonight.  Left hip pain.  EXAM: CHEST 1 VIEW  COMPARISON:  12/23/2012  FINDINGS: Shallow inspiration. Normal heart size and pulmonary vascularity. Hilar structures are somewhat prominent. This may be due to shallow inspiration and positioning but increased central vascularity or adenopathy are not excluded. Suggest initial evaluation with upright PA and lateral. Linear atelectasis in the left lung base. No blunting of costophrenic  angles. No pneumothorax. Calcified and tortuous aorta. Possible left lower rib fractures.  IMPRESSION: Shallow inspiration with atelectasis in the left lung base. Nonspecific prominence of the hila bilaterally may be due to positioning or shallow inspiration. Recommend a follow-up with upright PA and lateral views. Possible left rib fractures.   Electronically Signed   By: Burman Nieves M.D.   On: 09/06/2015 04:58   Dg Knee 2 Views Right  09/06/2015   CLINICAL DATA:  Larey Seat down steps tonight.  Left hip pain.  EXAM: RIGHT KNEE - 1-2 VIEW  COMPARISON:  None.  FINDINGS: Examination is technically limited due to overlying densities, likely clothing or blankets. Diffuse bone demineralization. Degenerative changes in the right knee with tricompartment narrowing and prominent osteophytosis. No acute fractures or dislocation identified. No significant effusion. No focal bone lesion. Vascular calcifications.  IMPRESSION: Moderately severe tricompartment degenerative changes in the right knee. No acute displaced fractures identified.   Electronically Signed   By: Burman Nieves M.D.   On:  09/06/2015 05:01   Ct Head Wo Contrast  09/06/2015   CLINICAL DATA:  Initial valuation for acute trauma, fall.  EXAM: CT HEAD WITHOUT CONTRAST  CT CERVICAL SPINE WITHOUT CONTRAST  TECHNIQUE: Multidetector CT imaging of the head and cervical spine was performed following the standard protocol without intravenous contrast. Multiplanar CT image reconstructions of the cervical spine were also generated.  COMPARISON:  Prior study from 07/09/2015.  FINDINGS: CT HEAD FINDINGS  Scalp soft tissues within normal limits. No acute abnormality about the orbits.  Calvarium intact. Paranasal sinuses and mastoid air cells are clear.  Mild age-related atrophy with chronic microvascular ischemic disease. No acute intracranial hemorrhage or large vessel territory infarct. No mass lesion, midline shift, or mass effect. No hydrocephalus. No extra-axial fluid collection.  CT CERVICAL SPINE FINDINGS  Trace anterolisthesis of T1 on T2 and T2 on T3. Trace anterolisthesis of C4 on C5, with trace retrolisthesis of C5 on C6. Vertebral bodies otherwise normally aligned with preservation of the normal cervical lordosis. Vertebral body heights are preserved. Normal C1-2 articulations are intact. No prevertebral soft tissue swelling. No acute fracture or listhesis. Faint linear lucency through the left lateral mass of C5 on coronal sequence favored to reflect a normal nutrient foramen.  Moderate degenerative spondylolysis at C5-6 and C6-7.  Visualized soft tissues of the neck demonstrate no acute abnormality. Prominent vascular calcifications about the carotid bifurcations. Visualized lung apices are clear without evidence of apical pneumothorax.  IMPRESSION: CT BRAIN:  1. No acute intracranial process. 2. Mild age-related cerebral atrophy with chronic small vessel ischemic disease.  CT CERVICAL SPINE:  No acute traumatic injury within the cervical spine.   Electronically Signed   By: Rise Mu M.D.   On: 09/06/2015 06:48   Ct  Cervical Spine Wo Contrast  09/06/2015   CLINICAL DATA:  Initial valuation for acute trauma, fall.  EXAM: CT HEAD WITHOUT CONTRAST  CT CERVICAL SPINE WITHOUT CONTRAST  TECHNIQUE: Multidetector CT imaging of the head and cervical spine was performed following the standard protocol without intravenous contrast. Multiplanar CT image reconstructions of the cervical spine were also generated.  COMPARISON:  Prior study from 07/09/2015.  FINDINGS: CT HEAD FINDINGS  Scalp soft tissues within normal limits. No acute abnormality about the orbits.  Calvarium intact. Paranasal sinuses and mastoid air cells are clear.  Mild age-related atrophy with chronic microvascular ischemic disease. No acute intracranial hemorrhage or large vessel territory infarct. No mass lesion, midline shift, or mass effect. No  hydrocephalus. No extra-axial fluid collection.  CT CERVICAL SPINE FINDINGS  Trace anterolisthesis of T1 on T2 and T2 on T3. Trace anterolisthesis of C4 on C5, with trace retrolisthesis of C5 on C6. Vertebral bodies otherwise normally aligned with preservation of the normal cervical lordosis. Vertebral body heights are preserved. Normal C1-2 articulations are intact. No prevertebral soft tissue swelling. No acute fracture or listhesis. Faint linear lucency through the left lateral mass of C5 on coronal sequence favored to reflect a normal nutrient foramen.  Moderate degenerative spondylolysis at C5-6 and C6-7.  Visualized soft tissues of the neck demonstrate no acute abnormality. Prominent vascular calcifications about the carotid bifurcations. Visualized lung apices are clear without evidence of apical pneumothorax.  IMPRESSION: CT BRAIN:  1. No acute intracranial process. 2. Mild age-related cerebral atrophy with chronic small vessel ischemic disease.  CT CERVICAL SPINE:  No acute traumatic injury within the cervical spine.   Electronically Signed   By: Rise Mu M.D.   On: 09/06/2015 06:48   Dg Hip Unilat  With  Pelvis 2-3 Views Right  09/06/2015   CLINICAL DATA:  Larey Seat down steps tonight.  Left hip pain.  EXAM: DG HIP (WITH OR WITHOUT PELVIS) 2-3V RIGHT  COMPARISON:  None.  FINDINGS: Probable impacted fracture of the base of the right femoral neck with fracture extending to the lesser trochanter. Mild displacement of lesser trochanteric fragment. Mild degenerative changes in the right hip. No focal bone lesion or bone destruction. Degenerative changes in the lower lumbar spine. Visualize left hip and pelvis appear intact.  IMPRESSION: Impacted fracture of the junction of the neck and intertrochanteric region of the right proximal femur with displaced lesser trochanteric fragment.   Electronically Signed   By: Burman Nieves M.D.   On: 09/06/2015 04:48   Dg Femur, Min 2 Views Right  09/06/2015   CLINICAL DATA:  Larey Seat down steps.  Left hip pain.  EXAM: RIGHT FEMUR 2 VIEWS  COMPARISON:  None.  FINDINGS: The impacted fracture of the base of the right femoral neck with displaced fracture of the lesser trochanter. Distal femoral shaft appears intact without additional fracture. Right knee is included on examination of the right knee obtained at the same time.  IMPRESSION: Impacted fracture at the base of the right femoral neck with extension to the lesser trochanter.   Electronically Signed   By: Burman Nieves M.D.   On: 09/06/2015 05:44    Almon Hercules, MD 09/06/2015, 6:34 AM PGY-1,  Family Medicine FPTS Intern pager: [phone], text pages welcome

## 2015-09-06 NOTE — Transfer of Care (Signed)
Immediate Anesthesia Transfer of Care Note  Patient: Nancy Park  Procedure(s) Performed: Procedure(s): INTRAMEDULLARY (IM) NAIL INTERTROCHANTRIC (Right)  Patient Location: PACU  Anesthesia Type:Spinal  Level of Consciousness: awake  Airway & Oxygen Therapy: Patient Spontanous Breathing and Patient connected to face mask oxygen  Post-op Assessment: Report given to RN and Post -op Vital signs reviewed and stable  Post vital signs: Reviewed and stable  Last Vitals:  Filed Vitals:   09/06/15 2115  BP:   Pulse:   Temp: 36.5 C  Resp:     Complications: No apparent anesthesia complications

## 2015-09-06 NOTE — ED Notes (Signed)
Attempted report 

## 2015-09-06 NOTE — ED Notes (Signed)
Pt smiling in triage, confused to time and situation. Is able to tell RN her name and birth date, however unable to report year and [place] president. States its Monday 1915. Unclear if pt is at baseline d/t lack of family presence, however EMS reports pt is at baseline mental status.

## 2015-09-06 NOTE — ED Notes (Signed)
Pt arrives via EMS with c/o fall, c/o R leg pain. No obvious trauma, pt alert. Fell down two steps mechanical fall d/t darkness/new building, fell on R side. No LOC, no blood thinners.  No deformity. Pain begins at R knee.

## 2015-09-06 NOTE — Consult Note (Signed)
Reason for Consult:  Right hip fracture Referring Physician:   EDP  Nancy Park is an 81 y.o. female.  HPI:   81 yo female who lives with family at home and is mildly demented who was brought to the ED early this am after a mechanical fall and the inability to put weight on her right hip.  X-rays confirmed a right hip fracture.  She does report right hip pain.  She mainly ambulates at home.  The family states that she has good days and bad days in terms of her memory.  The Medicine Service is also involved with her care today per the hip fracture protocol.  Past Medical History  Diagnosis Date  . Depression   . Fracture of arm 2000    right  . Hypertension   . Osteoporosis   . Brain aneurysm     History reviewed. No pertinent past surgical history.  Family History  Problem Relation Age of Onset  . Diabetes Mother   . Cancer Mother   . Goiter Mother   . Stroke Father   . Cancer Sister 38    breast  . Cancer Brother     skin    Social History:  reports that she has never smoked. She has never used smokeless tobacco. She reports that she does not drink alcohol or use illicit drugs.  Allergies:  Allergies  Allergen Reactions  . Tramadol     REACTION: Fatigue    Medications: I have reviewed the patient's current medications.  Results for orders placed or performed during the hospital encounter of 09/06/15 (from the past 48 hour(s))  CBC with Differential/Platelet     Status: Abnormal   Collection Time: 09/06/15  5:34 AM  Result Value Ref Range   WBC 6.7 4.0 - 10.5 K/uL   RBC 3.36 (L) 3.87 - 5.11 MIL/uL   Hemoglobin 11.0 (L) 12.0 - 15.0 g/dL   HCT 16.1 (L) 09.6 - 04.5 %   MCV 98.5 78.0 - 100.0 fL   MCH 32.7 26.0 - 34.0 pg   MCHC 33.2 30.0 - 36.0 g/dL   RDW 40.9 81.1 - 91.4 %   Platelets 257 150 - 400 K/uL   Neutrophils Relative % 77 %   Neutro Abs 5.2 1.7 - 7.7 K/uL   Lymphocytes Relative 15 %   Lymphs Abs 1.0 0.7 - 4.0 K/uL   Monocytes Relative 5 %    Monocytes Absolute 0.4 0.1 - 1.0 K/uL   Eosinophils Relative 2 %   Eosinophils Absolute 0.1 0.0 - 0.7 K/uL   Basophils Relative 1 %   Basophils Absolute 0.1 0.0 - 0.1 K/uL  I-stat chem 8, ed     Status: Abnormal   Collection Time: 09/06/15  5:39 AM  Result Value Ref Range   Sodium 140 135 - 145 mmol/L   Potassium 4.3 3.5 - 5.1 mmol/L   Chloride 104 101 - 111 mmol/L   BUN 19 6 - 20 mg/dL   Creatinine, Ser 7.82 0.44 - 1.00 mg/dL   Glucose, Bld 956 (H) 65 - 99 mg/dL   Calcium, Ion 2.13 0.86 - 1.30 mmol/L   TCO2 23 0 - 100 mmol/L   Hemoglobin 11.9 (L) 12.0 - 15.0 g/dL   HCT 57.8 (L) 46.9 - 62.9 %    Dg Chest 1 View  09/06/2015   CLINICAL DATA:  Larey Seat down steps tonight.  Left hip pain.  EXAM: CHEST 1 VIEW  COMPARISON:  12/23/2012  FINDINGS: Shallow inspiration.  Normal heart size and pulmonary vascularity. Hilar structures are somewhat prominent. This may be due to shallow inspiration and positioning but increased central vascularity or adenopathy are not excluded. Suggest initial evaluation with upright PA and lateral. Linear atelectasis in the left lung base. No blunting of costophrenic angles. No pneumothorax. Calcified and tortuous aorta. Possible left lower rib fractures.  IMPRESSION: Shallow inspiration with atelectasis in the left lung base. Nonspecific prominence of the hila bilaterally may be due to positioning or shallow inspiration. Recommend a follow-up with upright PA and lateral views. Possible left rib fractures.   Electronically Signed   By: Burman Nieves M.D.   On: 09/06/2015 04:58   Dg Knee 2 Views Right  09/06/2015   CLINICAL DATA:  Larey Seat down steps tonight.  Left hip pain.  EXAM: RIGHT KNEE - 1-2 VIEW  COMPARISON:  None.  FINDINGS: Examination is technically limited due to overlying densities, likely clothing or blankets. Diffuse bone demineralization. Degenerative changes in the right knee with tricompartment narrowing and prominent osteophytosis. No acute fractures or  dislocation identified. No significant effusion. No focal bone lesion. Vascular calcifications.  IMPRESSION: Moderately severe tricompartment degenerative changes in the right knee. No acute displaced fractures identified.   Electronically Signed   By: Burman Nieves M.D.   On: 09/06/2015 05:01   Ct Head Wo Contrast  09/06/2015   CLINICAL DATA:  Initial valuation for acute trauma, fall.  EXAM: CT HEAD WITHOUT CONTRAST  CT CERVICAL SPINE WITHOUT CONTRAST  TECHNIQUE: Multidetector CT imaging of the head and cervical spine was performed following the standard protocol without intravenous contrast. Multiplanar CT image reconstructions of the cervical spine were also generated.  COMPARISON:  Prior study from 07/09/2015.  FINDINGS: CT HEAD FINDINGS  Scalp soft tissues within normal limits. No acute abnormality about the orbits.  Calvarium intact. Paranasal sinuses and mastoid air cells are clear.  Mild age-related atrophy with chronic microvascular ischemic disease. No acute intracranial hemorrhage or large vessel territory infarct. No mass lesion, midline shift, or mass effect. No hydrocephalus. No extra-axial fluid collection.  CT CERVICAL SPINE FINDINGS  Trace anterolisthesis of T1 on T2 and T2 on T3. Trace anterolisthesis of C4 on C5, with trace retrolisthesis of C5 on C6. Vertebral bodies otherwise normally aligned with preservation of the normal cervical lordosis. Vertebral body heights are preserved. Normal C1-2 articulations are intact. No prevertebral soft tissue swelling. No acute fracture or listhesis. Faint linear lucency through the left lateral mass of C5 on coronal sequence favored to reflect a normal nutrient foramen.  Moderate degenerative spondylolysis at C5-6 and C6-7.  Visualized soft tissues of the neck demonstrate no acute abnormality. Prominent vascular calcifications about the carotid bifurcations. Visualized lung apices are clear without evidence of apical pneumothorax.  IMPRESSION: CT  BRAIN:  1. No acute intracranial process. 2. Mild age-related cerebral atrophy with chronic small vessel ischemic disease.  CT CERVICAL SPINE:  No acute traumatic injury within the cervical spine.   Electronically Signed   By: Rise Mu M.D.   On: 09/06/2015 06:48   Ct Cervical Spine Wo Contrast  09/06/2015   CLINICAL DATA:  Initial valuation for acute trauma, fall.  EXAM: CT HEAD WITHOUT CONTRAST  CT CERVICAL SPINE WITHOUT CONTRAST  TECHNIQUE: Multidetector CT imaging of the head and cervical spine was performed following the standard protocol without intravenous contrast. Multiplanar CT image reconstructions of the cervical spine were also generated.  COMPARISON:  Prior study from 07/09/2015.  FINDINGS: CT HEAD FINDINGS  Scalp soft  tissues within normal limits. No acute abnormality about the orbits.  Calvarium intact. Paranasal sinuses and mastoid air cells are clear.  Mild age-related atrophy with chronic microvascular ischemic disease. No acute intracranial hemorrhage or large vessel territory infarct. No mass lesion, midline shift, or mass effect. No hydrocephalus. No extra-axial fluid collection.  CT CERVICAL SPINE FINDINGS  Trace anterolisthesis of T1 on T2 and T2 on T3. Trace anterolisthesis of C4 on C5, with trace retrolisthesis of C5 on C6. Vertebral bodies otherwise normally aligned with preservation of the normal cervical lordosis. Vertebral body heights are preserved. Normal C1-2 articulations are intact. No prevertebral soft tissue swelling. No acute fracture or listhesis. Faint linear lucency through the left lateral mass of C5 on coronal sequence favored to reflect a normal nutrient foramen.  Moderate degenerative spondylolysis at C5-6 and C6-7.  Visualized soft tissues of the neck demonstrate no acute abnormality. Prominent vascular calcifications about the carotid bifurcations. Visualized lung apices are clear without evidence of apical pneumothorax.  IMPRESSION: CT BRAIN:  1. No  acute intracranial process. 2. Mild age-related cerebral atrophy with chronic small vessel ischemic disease.  CT CERVICAL SPINE:  No acute traumatic injury within the cervical spine.   Electronically Signed   By: Rise Mu M.D.   On: 09/06/2015 06:48   Dg Hip Unilat  With Pelvis 2-3 Views Right  09/06/2015   CLINICAL DATA:  Larey Seat down steps tonight.  Left hip pain.  EXAM: DG HIP (WITH OR WITHOUT PELVIS) 2-3V RIGHT  COMPARISON:  None.  FINDINGS: Probable impacted fracture of the base of the right femoral neck with fracture extending to the lesser trochanter. Mild displacement of lesser trochanteric fragment. Mild degenerative changes in the right hip. No focal bone lesion or bone destruction. Degenerative changes in the lower lumbar spine. Visualize left hip and pelvis appear intact.  IMPRESSION: Impacted fracture of the junction of the neck and intertrochanteric region of the right proximal femur with displaced lesser trochanteric fragment.   Electronically Signed   By: Burman Nieves M.D.   On: 09/06/2015 04:48   Dg Femur, Min 2 Views Right  09/06/2015   CLINICAL DATA:  Larey Seat down steps.  Left hip pain.  EXAM: RIGHT FEMUR 2 VIEWS  COMPARISON:  None.  FINDINGS: The impacted fracture of the base of the right femoral neck with displaced fracture of the lesser trochanter. Distal femoral shaft appears intact without additional fracture. Right knee is included on examination of the right knee obtained at the same time.  IMPRESSION: Impacted fracture at the base of the right femoral neck with extension to the lesser trochanter.   Electronically Signed   By: Burman Nieves M.D.   On: 09/06/2015 05:44    Review of Systems  Psychiatric/Behavioral: Positive for memory loss.  All other systems reviewed and are negative.  Blood pressure 163/43, pulse 63, temperature 98.1 F (36.7 C), temperature source Oral, resp. rate 15, height  (1.499 m), weight 45.36 kg (100 lb), SpO2 97 %. Physical Exam   Constitutional: She appears well-developed and well-nourished.  HENT:  Head: Normocephalic and atraumatic.  Neck: Normal range of motion. Neck supple.  Cardiovascular: Normal rate.   Respiratory: Effort normal and breath sounds normal.  GI: Soft. Bowel sounds are normal.  Musculoskeletal:       Right hip: She exhibits decreased range of motion, decreased strength, tenderness, bony tenderness and deformity.  Neurological: She is alert.  Skin: Skin is warm and dry.  Psychiatric: She has a normal mood  and affect.    Assessment/Plan: Displaced right hip fracture (intertrochanteric) 1)  I have spoken to her family at the bedside in detail to explain her injury as well as the reasoning behind surgery.  We discussed the risks and benefits involved.  Will plan on surgery at the end of the day with the goals of increasing her mobility and decreasing her pain.  Lyriq Finerty Y 09/06/2015, 7:53 AM

## 2015-09-06 NOTE — ED Provider Notes (Signed)
CSN: [id number]     Arrival date & time 09/06/15  4098 History   First MD Initiated Contact with Patient 09/06/15 0425     Chief Complaint  Patient presents with  . Fall     (Consider location/radiation/quality/duration/timing/severity/associated sxs/prior Treatment) HPI Comments: DS is a 81 y/o female with PMH of HTN and osteoporosis who presents today s/p mechanical fall. She reports that she was trying to use the bathroom early this a.m. and missed two steps and fell onto her back. She reports some low back pain after the fall as well as right hip pain that radiates to just below the knee. She denies any syncope, dizziness, or palpitations prior to the fall but states that she lost consciousness after she fell. Denies fever, chills, abdominal pain, n/v/d, SOB, cough, or chest pain.  The history is provided by the patient.    Past Medical History  Diagnosis Date  . Depression   . Fracture of arm 2000    right  . Hypertension   . Osteoporosis   . Brain aneurysm    History reviewed. No pertinent past surgical history. Family History  Problem Relation Age of Onset  . Diabetes Mother   . Cancer Mother   . Goiter Mother   . Stroke Father   . Cancer Sister 69    breast  . Cancer Brother     skin   Social History  Substance Use Topics  . Smoking status: Never Smoker   . Smokeless tobacco: Never Used  . Alcohol Use: No   OB History    Gravida Para Term Preterm AB TAB SAB Ectopic Multiple Living   3              Review of Systems  Constitutional: Positive for activity change.  Respiratory: Negative for shortness of breath.   Cardiovascular: Negative for chest pain.  Gastrointestinal: Negative for nausea, vomiting and abdominal pain.  Genitourinary: Negative for dysuria.  Musculoskeletal: Positive for arthralgias. Negative for neck pain.  Neurological: Negative for headaches.      Allergies  Tramadol  Home Medications   Prior to Admission medications    Medication Sig Start Date End Date Taking? Authorizing Provider  acetaminophen (TYLENOL) 500 MG tablet Take 2 tablets (1,000 mg total) by mouth every 12 (twelve) hours. 02/12/15   Glori Luis, MD  aspirin EC 81 MG tablet Take 81 mg by mouth daily.    Historical Provider, MD  Calcium Carbonate-Vitamin D (CALCIUM 600-D) 600-400 MG-UNIT per tablet Take 1 tablet by mouth 2 (two) times daily. 03/08/15   Glori Luis, MD  docusate sodium (COLACE) 100 MG capsule Take 1 capsule (100 mg total) by mouth daily. 06/29/15   Alyssa A Haney, MD  fluticasone (FLONASE) 50 MCG/ACT nasal spray Place 1 spray into the nose daily. 08/14/13   Amber Nydia Bouton, MD  lisinopril (PRINIVIL,ZESTRIL) 10 MG tablet Take 1 tablet (10 mg total) by mouth daily. 02/11/15 02/11/16  Glori Luis, MD  Tdap Leda Min) 5-2.5-18.5 LF-MCG/0.5 injection Inject 0.5 mLs into the muscle once. 08/31/15   Palma Holter, MD   BP 163/43 mmHg  Pulse 63  Temp(Src) 98.1 F (36.7 C) (Oral)  Resp 15  Ht  (1.499 m)  Wt 100 lb (45.36 kg)  BMI 20.19 kg/m2  SpO2 97% Physical Exam  Constitutional: She is oriented to person, place, and time. She appears well-developed.  HENT:  Head: Normocephalic and atraumatic.  Eyes: EOM are normal.  Neck: Normal  range of motion. Neck supple.  Cardiovascular: Normal rate.   Pulmonary/Chest: Effort normal.  Abdominal: Bowel sounds are normal. There is tenderness.  Right hip tenderness  Neurological: She is alert and oriented to person, place, and time.  Skin: Skin is warm and dry.    ED Course  Procedures (including critical care time) Labs Review Labs Reviewed  CBC WITH DIFFERENTIAL/PLATELET - Abnormal; Notable for the following:    RBC 3.36 (*)    Hemoglobin 11.0 (*)    HCT 33.1 (*)    All other components within normal limits  I-STAT CHEM 8, ED - Abnormal; Notable for the following:    Glucose, Bld 108 (*)    Hemoglobin 11.9 (*)    HCT 35.0 (*)    All other components  within normal limits  BASIC METABOLIC PANEL    Imaging Review Dg Chest 1 View  09/06/2015   CLINICAL DATA:  Larey Seat down steps tonight.  Left hip pain.  EXAM: CHEST 1 VIEW  COMPARISON:  12/23/2012  FINDINGS: Shallow inspiration. Normal heart size and pulmonary vascularity. Hilar structures are somewhat prominent. This may be due to shallow inspiration and positioning but increased central vascularity or adenopathy are not excluded. Suggest initial evaluation with upright PA and lateral. Linear atelectasis in the left lung base. No blunting of costophrenic angles. No pneumothorax. Calcified and tortuous aorta. Possible left lower rib fractures.  IMPRESSION: Shallow inspiration with atelectasis in the left lung base. Nonspecific prominence of the hila bilaterally may be due to positioning or shallow inspiration. Recommend a follow-up with upright PA and lateral views. Possible left rib fractures.   Electronically Signed   By: Burman Nieves M.D.   On: 09/06/2015 04:58   Dg Knee 2 Views Right  09/06/2015   CLINICAL DATA:  Larey Seat down steps tonight.  Left hip pain.  EXAM: RIGHT KNEE - 1-2 VIEW  COMPARISON:  None.  FINDINGS: Examination is technically limited due to overlying densities, likely clothing or blankets. Diffuse bone demineralization. Degenerative changes in the right knee with tricompartment narrowing and prominent osteophytosis. No acute fractures or dislocation identified. No significant effusion. No focal bone lesion. Vascular calcifications.  IMPRESSION: Moderately severe tricompartment degenerative changes in the right knee. No acute displaced fractures identified.   Electronically Signed   By: Burman Nieves M.D.   On: 09/06/2015 05:01   Dg Hip Unilat  With Pelvis 2-3 Views Right  09/06/2015   CLINICAL DATA:  Larey Seat down steps tonight.  Left hip pain.  EXAM: DG HIP (WITH OR WITHOUT PELVIS) 2-3V RIGHT  COMPARISON:  None.  FINDINGS: Probable impacted fracture of the base of the right femoral  neck with fracture extending to the lesser trochanter. Mild displacement of lesser trochanteric fragment. Mild degenerative changes in the right hip. No focal bone lesion or bone destruction. Degenerative changes in the lower lumbar spine. Visualize left hip and pelvis appear intact.  IMPRESSION: Impacted fracture of the junction of the neck and intertrochanteric region of the right proximal femur with displaced lesser trochanteric fragment.   Electronically Signed   By: Burman Nieves M.D.   On: 09/06/2015 04:48   Dg Femur, Min 2 Views Right  09/06/2015   CLINICAL DATA:  Larey Seat down steps.  Left hip pain.  EXAM: RIGHT FEMUR 2 VIEWS  COMPARISON:  None.  FINDINGS: The impacted fracture of the base of the right femoral neck with displaced fracture of the lesser trochanter. Distal femoral shaft appears intact without additional fracture. Right knee is included  on examination of the right knee obtained at the same time.  IMPRESSION: Impacted fracture at the base of the right femoral neck with extension to the lesser trochanter.   Electronically Signed   By: Burman Nieves M.D.   On: 09/06/2015 05:44   I have personally reviewed and evaluated these images and lab results as part of my medical decision-making.   EKG Interpretation None      Date: 09/06/2015  Rate: 60  Rhythm: normal sinus rhythm  QRS Axis: normal  Intervals: normal  ST/T Wave abnormalities: normal  Conduction Disutrbances: none  Narrative Interpretation: unremarkable     MDM   Final diagnoses:  Closed fracture of right femur, unspecified fracture morphology, unspecified portion of femur, initial encounter (HCC)  Hip fracture, right, closed, initial encounter (HCC)    DDx includes: - Mechanical falls - ICH - Fractures - Contusions - Soft tissue injury  Mechanical fall. Has an impacted right femoral neck fracture. Neurovascularly intact. Spoke w/ Dr. Rayburn Ma, Ortho. PT to remain npo.    Derwood Kaplan,  MD 09/06/15 [phone]

## 2015-09-06 NOTE — Brief Op Note (Signed)
09/06/2015  9:17 PM  PATIENT:  Nancy Park  81 y.o. female  PRE-OPERATIVE DIAGNOSIS:  right intertroch fracture  POST-OPERATIVE DIAGNOSIS:  right intertroch fracture  PROCEDURE:  Procedure(s): INTRAMEDULLARY (IM) NAIL INTERTROCHANTRIC (Right)  SURGEON:  Surgeon(s) and Role:    * Kathryne Hitch, MD - Primary  ANESTHESIA:   spinal  EBL:  Total I/O In: -  Out: 200 [Urine:200]  BLOOD ADMINISTERED:none  COUNTS:  YES  TOURNIQUET:  * No tourniquets in log *  DICTATION: .Other Dictation: Dictation Number [id number]  PLAN OF CARE: Admit to inpatient   PATIENT DISPOSITION:  PACU - hemodynamically stable.   Delay start of Pharmacological VTE agent (>24hrs) due to surgical blood loss or risk of bleeding: no

## 2015-09-06 NOTE — Anesthesia Postprocedure Evaluation (Signed)
Anesthesia Post Note  Patient: Nancy Park  Procedure(s) Performed: Procedure(s) (LRB): INTRAMEDULLARY (IM) NAIL INTERTROCHANTRIC (Right)  Anesthesia type: MAC/SAB  Patient location: PACU  Post pain: Pain level controlled  Post assessment: Patient's Cardiovascular Status Stable, SAB receding  Last Vitals:  Filed Vitals:   09/06/15 2145  BP:   Pulse:   Temp: 36.5 C  Resp:     Post vital signs: Reviewed and stable  Level of consciousness: sedated  Complications: No apparent anesthesia complications

## 2015-09-06 NOTE — Progress Notes (Signed)
Call to get report from ED and told this RN that they'll gonna call back. Awaiting call back. Will pass on to next shift.

## 2015-09-06 NOTE — Anesthesia Preprocedure Evaluation (Addendum)
Anesthesia Evaluation  Patient identified by MRN, date of birth, ID band Patient awake    Reviewed: Allergy & Precautions, NPO status , Patient's Chart, lab work & pertinent test results  History of Anesthesia Complications Negative for: history of anesthetic complications  Airway Mallampati: I  TM Distance: >3 FB Neck ROM: Full    Dental  (+) Edentulous Upper, Edentulous Lower   Pulmonary neg pulmonary ROS,    Pulmonary exam normal        Cardiovascular hypertension, Pt. on medications + Peripheral Vascular Disease  Normal cardiovascular exam     Neuro/Psych Seizures -,  PSYCHIATRIC DISORDERS Depression Dementia    GI/Hepatic negative GI ROS, Neg liver ROS,   Endo/Other  negative endocrine ROS  Renal/GU negative Renal ROS     Musculoskeletal   Abdominal   Peds  Hematology   Anesthesia Other Findings   Reproductive/Obstetrics                            Anesthesia Physical Anesthesia Plan  ASA: III  Anesthesia Plan: MAC and Spinal   Post-op Pain Management:    Induction:   Airway Management Planned: Simple Face Mask  Additional Equipment:   Intra-op Plan:   Post-operative Plan:   Informed Consent: I have reviewed the patients History and Physical, chart, labs and discussed the procedure including the risks, benefits and alternatives for the proposed anesthesia with the patient or authorized representative who has indicated his/her understanding and acceptance.   Consent reviewed with POA  Plan Discussed with: CRNA, Anesthesiologist and Surgeon  Anesthesia Plan Comments:        Anesthesia Quick Evaluation

## 2015-09-07 ENCOUNTER — Encounter (HOSPITAL_COMMUNITY): Payer: Self-pay | Admitting: Orthopaedic Surgery

## 2015-09-07 DIAGNOSIS — S7291XA Unspecified fracture of right femur, initial encounter for closed fracture: Secondary | ICD-10-CM

## 2015-09-07 DIAGNOSIS — F0390 Unspecified dementia without behavioral disturbance: Secondary | ICD-10-CM

## 2015-09-07 DIAGNOSIS — I10 Essential (primary) hypertension: Secondary | ICD-10-CM | POA: Insufficient documentation

## 2015-09-07 DIAGNOSIS — N183 Chronic kidney disease, stage 3 unspecified: Secondary | ICD-10-CM | POA: Insufficient documentation

## 2015-09-07 LAB — BASIC METABOLIC PANEL
ANION GAP: 8 (ref 5–15)
BUN: 11 mg/dL (ref 6–20)
CO2: 24 mmol/L (ref 22–32)
CREATININE: 0.84 mg/dL (ref 0.44–1.00)
Calcium: 8.2 mg/dL — ABNORMAL LOW (ref 8.9–10.3)
Chloride: 104 mmol/L (ref 101–111)
GFR calc Af Amer: >60 mL/min (ref >60)
GFR calc non Af Amer: >60 mL/min (ref >60)
GLUCOSE: 148 mg/dL — AB (ref 65–99)
POTASSIUM: 3.7 mmol/L (ref 3.5–5.1)
SODIUM: 136 mmol/L (ref 135–145)

## 2015-09-07 LAB — CBC
HCT: 27.2 % — ABNORMAL LOW (ref 36.0–46.0)
Hemoglobin: 9.1 g/dL — ABNORMAL LOW (ref 12.0–15.0)
MCH: 32.6 pg (ref 26.0–34.0)
MCHC: 33.5 g/dL (ref 30.0–36.0)
MCV: 97.5 fL (ref 78.0–100.0)
PLATELETS: 201 10*3/uL (ref 150–400)
RBC: 2.79 MIL/uL — AB (ref 3.87–5.11)
RDW: 12 % (ref 11.5–15.5)
WBC: 6.8 10*3/uL (ref 4.0–10.5)

## 2015-09-07 MED ORDER — ASPIRIN 325 MG PO TBEC: 325.0000 mg | DELAYED_RELEASE_TABLET | Freq: Every day | ORAL | Status: DC

## 2015-09-07 MED ORDER — SENNA 8.6 MG PO TABS
1.0000 | ORAL_TABLET | Freq: Every day | ORAL | Status: DC
  Administered 2015-09-07 – 2015-09-08 (×2): 8.6 mg via ORAL
  Filled 2015-09-07 (×2): qty 1

## 2015-09-07 MED ORDER — INFLUENZA VAC SPLIT QUAD 0.5 ML IM SUSY
0.5000 mL | PREFILLED_SYRINGE | INTRAMUSCULAR | Status: DC
  Filled 2015-09-07 (×2): qty 0.5

## 2015-09-07 MED ORDER — HYDROCODONE-ACETAMINOPHEN 5-325 MG PO TABS: 1.0000 | ORAL_TABLET | Freq: Four times a day (QID) | ORAL | Status: AC | PRN

## 2015-09-07 MED ORDER — HYDROCODONE-ACETAMINOPHEN 5-325 MG PO TABS
0.5000 | ORAL_TABLET | Freq: Four times a day (QID) | ORAL | Status: DC
  Administered 2015-09-07 – 2015-09-09 (×9): 0.5 via ORAL
  Filled 2015-09-07 (×9): qty 1

## 2015-09-07 MED ORDER — ENOXAPARIN SODIUM 30 MG/0.3ML ~~LOC~~ SOLN
30.0000 mg | SUBCUTANEOUS | Status: DC
  Administered 2015-09-07: 30 mg via SUBCUTANEOUS
  Filled 2015-09-07: qty 0.3

## 2015-09-07 NOTE — Clinical Social Work Note (Signed)
CSW received referral for SNF placement for patient to receive short term rehab.  CSW met with patient, her daughter Mardene Celeste [phone], and grandaughter Angela Nevin [phone] to discuss SNF search process and how insurance pays for placement.  Patient and daughter gave permission for CSW to begin SNF search process, patient to be faxed out looking for placement, formal assessment to follow.  Jones Broom. Cotton, MSW, [place] 09/07/2015 6:32 PM

## 2015-09-07 NOTE — Clinical Documentation Improvement (Signed)
[facility]  Abnormal Lab/Test Results:   Component      RBC Hemoglobin HCT  Latest Ref Rng      3.87 - 5.11 MIL/uL 12.0 - 15.0 g/dL 04.5 - 40.9 %  81/19/1478     5:34 AM 3.36 (L) 11.0 (L) 33.1 (L)  09/06/2015     5:39 AM  11.9 (L) 35.0 (L)  09/06/2015     8:16 AM 3.40 (L) 11.2 (L) 33.5 (L)  09/07/2015      2.79 (L) 9.1 (L) 27.2 (L)   Possible Clinical Conditions associated with below indicators  Acute blood loss anemia on chronic anemia (please specify type of chronic if known)  Acute blood loss anemia  Other Condition  Cannot Clinically Determine   Supporting Information: Current documentation notes "CBC 9.1.  11 on admission".  Estimated blood loss per anesthesia record 100cc.  Full operative note not yet available.    Please exercise your independent, professional judgment when responding. A specific answer is not anticipated or expected.  Thank you, Doy Mince, RN [phone] Clinical Documentation Specialist

## 2015-09-07 NOTE — Evaluation (Signed)
Occupational Therapy Evaluation Patient Details Name: Nancy Park MRN: [id number] DOB: [birth year] Today's Date: 09/07/2015    History of Present Illness 81 y.o. female presenting with right hip fracture after fall at home. PMH is significant for hypertension & CKD-3 and osteopnea   Clinical Impression   Pt reports she was independent with ADLs PTA. Pt mildly confused at this time, oriented to person and place. At this time, recommending d/c to SNF for further rehab in order to maximize independence and safety with ADLs and functional mobility required to safely return home. Pt would benefit from continued skilled OT services to increase independence with toilet transfers and LB ADLs.     Follow Up Recommendations  SNF;Supervision/Assistance - 24 hour    Equipment Recommendations  Other (comment) (TBD at next venue)    Recommendations for Other Services       Precautions / Restrictions Precautions Precautions: None Restrictions Weight Bearing Restrictions: Yes RLE Weight Bearing: Weight bearing as tolerated      Mobility Bed Mobility Overal bed mobility: Needs Assistance Bed Mobility: Supine to Sit     Supine to sit: Min assist;HOB elevated     General bed mobility comments: Pt in recliner, returned to recliner at end of session  Transfers Overall transfer level: Needs assistance Equipment used: Rolling walker (2 wheeled) Transfers: Sit to/from Stand Sit to Stand: Min assist         General transfer comment: Verbal cues for safety and hand placement, pt unable to follow instructions    Balance Overall balance assessment: History of Falls;Needs assistance         Standing balance support: Bilateral upper extremity supported Standing balance-Leahy Scale: Poor                              ADL Overall ADL's : Needs assistance/impaired     Grooming: Supervision/safety;Sitting               Lower Body Dressing: Moderate  assistance;Sit to/from stand   Toilet Transfer: Minimal assistance;Stand-pivot;BSC;RW           Functional mobility during ADLs: Minimal assistance;Rolling walker General ADL Comments: No family present during OT eval. Educated pt on transfer technique with RW, hand placement; pt verbalized understanding but did not carry over during functional transfer.      Vision     Perception     Praxis      Pertinent Vitals/Pain Pain Assessment: 0-10 Pain Score: 5  Faces Pain Scale: Hurts little more Pain Location: R hip Pain Intervention(s): Limited activity within patient's tolerance;Monitored during session     Hand Dominance Right   Extremity/Trunk Assessment Upper Extremity Assessment Upper Extremity Assessment: Overall WFL for tasks assessed   Lower Extremity Assessment Lower Extremity Assessment: Defer to PT evaluation RLE Deficits / Details: AAROM to 90 RLE: Unable to fully assess due to pain (and confusion)   Cervical / Trunk Assessment Cervical / Trunk Assessment: Kyphotic (slight)   Communication Communication Communication: No difficulties   Cognition Arousal/Alertness: Awake/alert Behavior During Therapy: WFL for tasks assessed/performed Overall Cognitive Status: No family/caregiver present to determine baseline cognitive functioning (confused )                     General Comments       Exercises Exercises: General Lower Extremity     Shoulder Instructions      Home Living Family/patient expects to be discharged to::  Skilled nursing facility                                 Additional Comments: lived at home with grandson      Prior Functioning/Environment Level of Independence: Needs assistance  Gait / Transfers Assistance Needed: per PT: per chart, 5 falls in past 12 mo ADL's / Homemaking Assistance Needed: pt reports she was independent with ADLs        OT Diagnosis: Generalized weakness;Acute pain   OT Problem List:  Decreased strength;Decreased range of motion;Decreased activity tolerance;Impaired balance (sitting and/or standing);Decreased cognition;Decreased safety awareness;Decreased knowledge of use of DME or AE;Decreased knowledge of precautions;Pain   OT Treatment/Interventions: Self-care/ADL training;DME and/or AE instruction;Energy conservation;Patient/family education    OT Goals(Current goals can be found in the care plan section) Acute Rehab OT Goals Patient Stated Goal: none stated OT Goal Formulation: With patient Time For Goal Achievement: 09/21/15 Potential to Achieve Goals: Fair ADL Goals Pt Will Perform Grooming: with supervision;standing Pt Will Perform Lower Body Bathing: sit to/from stand;with min guard assist Pt Will Perform Lower Body Dressing: sit to/from stand;with min guard assist Pt Will Transfer to Toilet: with min guard assist;ambulating  OT Frequency: Min 2X/week   Barriers to D/C: Decreased caregiver support          Co-evaluation              End of Session Equipment Utilized During Treatment: Gait belt;Rolling walker  Activity Tolerance: Patient tolerated treatment well Patient left: in chair;with call bell/phone within reach;with chair alarm set   Time: 1145-1157 OT Time Calculation (min): 12 min Charges:  OT General Charges $OT Visit: 1 Procedure OT Evaluation $Initial OT Evaluation Tier I: 1 Procedure G-Codes:     Gaye Alken M.S., OTR/L Pager: [phone]  09/07/2015, 12:08 PM

## 2015-09-07 NOTE — Progress Notes (Signed)
Patient ID: Nancy Park, female   DOB: 1925/12/11, 81 y.o.   MRN: [id number] Awake with family at the bedside.  Less pain post repair of right hip fracture last night.  Can start therapy today with full weight bearing as tolerated on right leg.  Will likely need SNF placement post-hospital stay.

## 2015-09-07 NOTE — Op Note (Signed)
NAMEMarland Kitchen  Nancy Park, Nancy Park.:  [id number]  MEDICAL RECORD NO.:  [id number]  LOCATION:  5N13C                        FACILITY:  [facility]  PHYSICIAN:  Vanita Panda. Magnus Ivan, M.D.DATE OF BIRTH:  1925-12-26  DATE OF PROCEDURE:  09/06/2015 DATE OF DISCHARGE:                              OPERATIVE REPORT   PREOPERATIVE DIAGNOSIS:  Right displaced intertrochanteric hip/femur fracture.  POSTOPERATIVE DIAGNOSIS:  Right displaced intertrochanteric hip/femur fracture.  PROCEDURE:  Open reduction and internal fixation of right intertrochanteric femur/hip fracture.  IMPLANTS:  Katrinka Blazing and Nephew InterTan 10 x 340 femoral nail with a 90/85 mm lag and compression screw.  SURGEON:  Vanita Panda. Magnus Ivan, MD  ANESTHESIA:  Spinal.  BLOOD LOSS:  100 mL.  ANTIBIOTICS:  2 g IV Ancef.  COMPLICATIONS:  None.  INDICATION:  Ms. Mitchener is an 81 year old mildly demented female who lives at home.  She was getting up in the early morning hours to go to the bathroom and sustained a mechanical fall.  She had the inability to ambulate and severe right hip pain.  She was brought to the [facility] Emergency Room and was found to have a displaced intertrochanteric hip fracture.  She was graciously admitted to the Medicine Service for medical management and clearance for surgery.  I have talked to her family in length about the risks and benefits of surgery including the risk of acute blood loss anemia and perioperative risk given her age. They understand the goals are decreased pain, improved mobility, and overall improved quality of life.  They wanted to be her to come back home to with ambulating as she was before.  She did not apparently use much of assistive device.  PROCEDURE DESCRIPTION:  After informed was obtained, appropriate right hip was marked.  She was brought to the operating room and spinal anesthesia was obtained while she was on her stretcher.  She was then placed  supine on the Hana fracture table with both legs in an inline skeletal traction devices and boots, but no traction applied on the left.  Nonoperative hip perineal post in place and the left hip had traction applied with some slight internal rotation.  We assessed this under direct fluoroscopy to get our fracture reduction.  We then chose a 10 x 340 a Smith and Nephew femoral nail keeping it sterile within its box to get our leg lengths and getting our nail length with it using C- arm.  We then prepped and draped the right hip with DuraPrep and sterile drapes.  Time-out was called.  She was identified as correct patient, correct right hip.  I then made incision just proximal to the greater trochanter and carried this down the tip of the greater trochanter.  I placed a temporary guide pin from the tip of the greater trochanter to just pass the lesser trochanter under direct fluoroscopy.  We then used this initiating of the femoral canal and once we did this, removed the guide pin and then placed the real 10 x 340 femoral nail down the femoral canal easily.  Using the outrigger guide, we made a separate incision on lateral aspect of the thigh.  We were able to drill  for lag screws through the lateral cortex and took measurements off the drill and chose a 90 and 85 lag and compression screw which was put without difficulty under direct fluoroscopy compressing the fracture.  We then removed the outrigger guide and irrigated 2 small wounds in normal saline solution using bulb syringe and normal saline.  We closed the deep tissue with 0 Vicryl followed by 2-0 Vicryl in subcutaneous tissue, and interrupted staples on the skin.  Xeroform and well-padded sterile dressing was applied.  She was taken off the Hana table and taken to the recovery room in stable condition.  All final counts were correct. There were no complications noted.     Vanita Panda. Magnus Ivan, M.D.     CYB/MEDQ  D:   09/06/2015  T:  09/07/2015  Job:  [id number]

## 2015-09-07 NOTE — Discharge Summary (Signed)
Family Medicine Teaching [facility] Discharge Summary  Patient name: Nancy Park Medical record number: [id number] Date of birth: [birth year] Age: 81 y.o. Gender: female Date of Admission: 09/06/2015  Date of Discharge: 09/09/2015 Admitting Physician: Latrelle Dodrill, MD  Primary Care Provider: Palma Holter, MD Consultants: orthopedics  Indication for Hospitalization: Right femoral neck fracture after fall  Discharge Diagnoses/Problem List:  Right femoral fracture s/p ORIF  Disposition: SNF  Discharge Condition: stable  Discharge Exam:  Gen: NAD, well-appearing  CV: RRR. S1 & S2 audible, 2/6 systolic murmurs over RUSB Chest: no tenderness to palpation Resp: no apparent WOB, CTAB. Abd: +BS. Soft, NDNT, no rebound or guarding.  Ext: appears symmetric, no edema or gross deformities. Surgical wound: dressing in place, some blood stain, not soaked, no swelling, some echymosis Neuro: Alert, oriented to self, place, person, month and year. No gross focal deficits  Brief Hospital Course:  Nancy Park is a 81 y.o. female who presented with right hip fracture after mechanical fall at home now s/p ORIF on 09/06/2015.  PMH is significant for multiple falls in the past (about 5 times over the last 12 months), hypertension, dementia CKD-3 and osteopnea.  Right femoral neck fracture: from mechanical fall after missing a couple of steps on her way to bathroom on night. Likely culprit is unfamiliar living environment with poor lighting. She recently moved in to live grandson. No LOC after fall. CBC and BMP within normal limit. CT head and neck without acute finding in head and neck. Right femoral neck fracture noted on X-ray. She was taken to surgery by orthopedics for ORIF on 09/06/2015. She tolerated the surgery well and remained stable then after. Her postsurgical CBC 9.1 and trended down to 8.2 then up to 8.6 on POD-2 from 11 on admission. This was likely hemodiluton  given that she had only about 100 ml of blood loss per op note. She remained hemodianamically stable as well. Started Lovenox 12 hours after surgery. However, this was discontinued as her orthopedic surgeon, Dr. Magnus Ivan wanted her on Asprin 325 mg once daily for 35 days. Evaluated by PT/OT who recommended SNF with rolling walker with 5" wheels and 24 hr supervision.  Hypertension: no cardiac hx. Last Echo 2012 with EF of 60-65%, mild AV regurg. On lisinopril 10 mg for hypertension at home which was held prior to surgery. Normotensive for most of her stay. Resumed on the day of discharge.  Other chronic conditions stable.  Issues for Follow Up:  -Surgical wound: assessment in two to three weeks. -Cognitive impairment: recommend formal evaluation -Fall risk assessment: recommend evaluation at follow up with PCP -CBC  Significant Procedures: ORIF on 09/06/2015  Significant Labs and Imaging:  Dg Chest 1 View  09/06/2015  CLINICAL DATA:  Larey Seat down steps tonight.  Left hip pain. EXAM: CHEST 1 VIEW COMPARISON:  12/23/2012 FINDINGS: Shallow inspiration. Normal heart size and pulmonary vascularity. Hilar structures are somewhat prominent. This may be due to shallow inspiration and positioning but increased central vascularity or adenopathy are not excluded. Suggest initial evaluation with upright PA and lateral. Linear atelectasis in the left lung base. No blunting of costophrenic angles. No pneumothorax. Calcified and tortuous aorta. Possible left lower rib fractures. IMPRESSION: Shallow inspiration with atelectasis in the left lung base. Nonspecific prominence of the hila bilaterally may be due to positioning or shallow inspiration. Recommend a follow-up with upright PA and lateral views. Possible left rib fractures. Electronically Signed   By: Marisa Cyphers.D.  On: 09/06/2015 04:58   Dg Knee 2 Views Right  09/06/2015  CLINICAL DATA:  Larey Seat down steps tonight.  Left hip pain. EXAM: RIGHT KNEE -  1-2 VIEW COMPARISON:  None. FINDINGS: Examination is technically limited due to overlying densities, likely clothing or blankets. Diffuse bone demineralization. Degenerative changes in the right knee with tricompartment narrowing and prominent osteophytosis. No acute fractures or dislocation identified. No significant effusion. No focal bone lesion. Vascular calcifications. IMPRESSION: Moderately severe tricompartment degenerative changes in the right knee. No acute displaced fractures identified. Electronically Signed   By: Burman Nieves M.D.   On: 09/06/2015 05:01   Ct Head Wo Contrast  09/06/2015  CLINICAL DATA:  Initial valuation for acute trauma, fall. EXAM: CT HEAD WITHOUT CONTRAST CT CERVICAL SPINE WITHOUT CONTRAST TECHNIQUE: Multidetector CT imaging of the head and cervical spine was performed following the standard protocol without intravenous contrast. Multiplanar CT image reconstructions of the cervical spine were also generated. COMPARISON:  Prior study from 07/09/2015. FINDINGS: CT HEAD FINDINGS Scalp soft tissues within normal limits. No acute abnormality about the orbits. Calvarium intact. Paranasal sinuses and mastoid air cells are clear. Mild age-related atrophy with chronic microvascular ischemic disease. No acute intracranial hemorrhage or large vessel territory infarct. No mass lesion, midline shift, or mass effect. No hydrocephalus. No extra-axial fluid collection. CT CERVICAL SPINE FINDINGS Trace anterolisthesis of T1 on T2 and T2 on T3. Trace anterolisthesis of C4 on C5, with trace retrolisthesis of C5 on C6. Vertebral bodies otherwise normally aligned with preservation of the normal cervical lordosis. Vertebral body heights are preserved. Normal C1-2 articulations are intact. No prevertebral soft tissue swelling. No acute fracture or listhesis. Faint linear lucency through the left lateral mass of C5 on coronal sequence favored to reflect a normal nutrient foramen. Moderate  degenerative spondylolysis at C5-6 and C6-7. Visualized soft tissues of the neck demonstrate no acute abnormality. Prominent vascular calcifications about the carotid bifurcations. Visualized lung apices are clear without evidence of apical pneumothorax. IMPRESSION: CT BRAIN: 1. No acute intracranial process. 2. Mild age-related cerebral atrophy with chronic small vessel ischemic disease. CT CERVICAL SPINE: No acute traumatic injury within the cervical spine. Electronically Signed   By: Rise Mu M.D.   On: 09/06/2015 06:48   Ct Cervical Spine Wo Contrast  09/06/2015  CLINICAL DATA:  Initial valuation for acute trauma, fall. EXAM: CT HEAD WITHOUT CONTRAST CT CERVICAL SPINE WITHOUT CONTRAST TECHNIQUE: Multidetector CT imaging of the head and cervical spine was performed following the standard protocol without intravenous contrast. Multiplanar CT image reconstructions of the cervical spine were also generated. COMPARISON:  Prior study from 07/09/2015. FINDINGS: CT HEAD FINDINGS Scalp soft tissues within normal limits. No acute abnormality about the orbits. Calvarium intact. Paranasal sinuses and mastoid air cells are clear. Mild age-related atrophy with chronic microvascular ischemic disease. No acute intracranial hemorrhage or large vessel territory infarct. No mass lesion, midline shift, or mass effect. No hydrocephalus. No extra-axial fluid collection. CT CERVICAL SPINE FINDINGS Trace anterolisthesis of T1 on T2 and T2 on T3. Trace anterolisthesis of C4 on C5, with trace retrolisthesis of C5 on C6. Vertebral bodies otherwise normally aligned with preservation of the normal cervical lordosis. Vertebral body heights are preserved. Normal C1-2 articulations are intact. No prevertebral soft tissue swelling. No acute fracture or listhesis. Faint linear lucency through the left lateral mass of C5 on coronal sequence favored to reflect a normal nutrient foramen. Moderate degenerative spondylolysis at C5-6  and C6-7. Visualized soft tissues of the neck  demonstrate no acute abnormality. Prominent vascular calcifications about the carotid bifurcations. Visualized lung apices are clear without evidence of apical pneumothorax. IMPRESSION: CT BRAIN: 1. No acute intracranial process. 2. Mild age-related cerebral atrophy with chronic small vessel ischemic disease. CT CERVICAL SPINE: No acute traumatic injury within the cervical spine. Electronically Signed   By: Rise Mu M.D.   On: 09/06/2015 06:48   Dg C-arm 1-60 Min  09/06/2015  CLINICAL DATA:  Operative imaging from pinning of a right proximal femur fracture. EXAM: DG C-ARM 61-120 MIN; RIGHT FEMUR 2 VIEWS COMPARISON:  09/06/2015 FINDINGS: Images show placement of 2 screws through the proximal aspect of an intra medullary rod reducing the fracture components into anatomic alignment. There is no new fracture. The orthopedic hardware is well-seated. Right hip joint is normally aligned. No evidence of an operative complication. IMPRESSION: Well aligned right proximal femur fracture components following ORIF. Electronically Signed   By: Amie Portland M.D.   On: 09/06/2015 21:29   Dg Hip Unilat  With Pelvis 2-3 Views Right  09/06/2015  CLINICAL DATA:  Larey Seat down steps tonight.  Left hip pain. EXAM: DG HIP (WITH OR WITHOUT PELVIS) 2-3V RIGHT COMPARISON:  None. FINDINGS: Probable impacted fracture of the base of the right femoral neck with fracture extending to the lesser trochanter. Mild displacement of lesser trochanteric fragment. Mild degenerative changes in the right hip. No focal bone lesion or bone destruction. Degenerative changes in the lower lumbar spine. Visualize left hip and pelvis appear intact. IMPRESSION: Impacted fracture of the junction of the neck and intertrochanteric region of the right proximal femur with displaced lesser trochanteric fragment. Electronically Signed   By: Burman Nieves M.D.   On: 09/06/2015 04:48   Dg Femur, Min 2  Views Right  09/06/2015  CLINICAL DATA:  Operative imaging from pinning of a right proximal femur fracture. EXAM: DG C-ARM 61-120 MIN; RIGHT FEMUR 2 VIEWS COMPARISON:  09/06/2015 FINDINGS: Images show placement of 2 screws through the proximal aspect of an intra medullary rod reducing the fracture components into anatomic alignment. There is no new fracture. The orthopedic hardware is well-seated. Right hip joint is normally aligned. No evidence of an operative complication. IMPRESSION: Well aligned right proximal femur fracture components following ORIF. Electronically Signed   By: Amie Portland M.D.   On: 09/06/2015 21:29   Dg Femur, Min 2 Views Right  09/06/2015  CLINICAL DATA:  Larey Seat down steps.  Left hip pain. EXAM: RIGHT FEMUR 2 VIEWS COMPARISON:  None. FINDINGS: The impacted fracture of the base of the right femoral neck with displaced fracture of the lesser trochanter. Distal femoral shaft appears intact without additional fracture. Right knee is included on examination of the right knee obtained at the same time. IMPRESSION: Impacted fracture at the base of the right femoral neck with extension to the lesser trochanter. Electronically Signed   By: Burman Nieves M.D.   On: 09/06/2015 05:44    Recent Labs Lab 09/08/15 0620 09/09/15 0553 09/09/15 1249  WBC 6.8 7.1 6.7  HGB 8.9* 8.2* 8.6*  HCT 26.6* 25.1* 26.1*  PLT 197 190 232    Recent Labs Lab 09/06/15 0539 09/06/15 0816 09/07/15 1029 09/09/15 0553  NA 140 138 136 136  K 4.3 4.4 3.7 3.8  CL 104 104 104 104  CO2  --  26 24 27   GLUCOSE 108* 116* 148* 98  BUN 19 15 11 10   CREATININE 0.90 0.76 0.84 0.74  CALCIUM  --  9.0 8.2* 8.0*  Results/Tests Pending at Time of Discharge: none  Discharge Medications:    Medication List    STOP taking these medications        acetaminophen 500 MG tablet  Commonly known as:  TYLENOL     Tdap 5-2.5-18.5 LF-MCG/0.5 injection  Commonly known as:  BOOSTRIX      TAKE these  medications        aspirin 325 MG EC tablet  Take 1 tablet (325 mg total) by mouth 2 (two) times daily. With meals.     Calcium Carbonate-Vitamin D 600-400 MG-UNIT tablet  Commonly known as:  CALCIUM 600-D  Take 1 tablet by mouth 2 (two) times daily.     docusate sodium 100 MG capsule  Commonly known as:  COLACE  Take 1 capsule (100 mg total) by mouth daily.     fluticasone 50 MCG/ACT nasal spray  Commonly known as:  FLONASE  Place 1 spray into the nose daily.     HYDROcodone-acetaminophen 5-325 MG tablet  Commonly known as:  NORCO/VICODIN  Take 1 tablet by mouth every 6 (six) hours as needed for moderate pain.     HYDROcodone-acetaminophen 5-325 MG tablet  Commonly known as:  NORCO/VICODIN  Take 0.5 tablets by mouth every 6 (six) hours.     lisinopril 10 MG tablet  Commonly known as:  PRINIVIL,ZESTRIL  Take 1 tablet (10 mg total) by mouth daily.     multivitamin with minerals tablet  Take 1 tablet by mouth daily.     polyethylene glycol packet  Commonly known as:  MIRALAX / GLYCOLAX  Take 17 g by mouth daily. As needed for constipation     senna 8.6 MG Tabs tablet  Commonly known as:  SENOKOT  Take 1 tablet (8.6 mg total) by mouth at bedtime.        Discharge Instructions: Please refer to Patient Instructions section of EMR for full details.  Patient was counseled important signs and symptoms that should prompt return to medical care, changes in medications, dietary instructions, activity restrictions, and follow up appointments.   Follow-Up Appointments: Follow-up Information    Follow up with Kathryne Hitch, MD. Schedule an appointment as soon as possible for a visit in 2 weeks.   Specialty:  Orthopedic Surgery   Contact information:   [address] [place] [address] [phone]       Almon Hercules, MD 09/09/2015, 2:37 PM PGY-1, [facility]

## 2015-09-07 NOTE — Evaluation (Signed)
Physical Therapy Evaluation Patient Details Name: Nancy Park MRN: [id number] DOB: 1926-10-11 Today's Date: 09/07/2015   History of Present Illness  81 y.o. female presenting with right hip fracture after fall at home. PMH is significant for hypertension & CKD-3 and osteopnea  Clinical Impression  Patient is s/p above surgery resulting in functional limitations due to the deficits listed below (see PT Problem List).  Patient will benefit from skilled PT to increase their independence and safety with mobility to allow discharge to the venue listed below.       Follow Up Recommendations SNF    Equipment Recommendations  Rolling walker with 5" wheels    Recommendations for Other Services       Precautions / Restrictions Precautions Precautions: None Restrictions Weight Bearing Restrictions: Yes RLE Weight Bearing: Weight bearing as tolerated      Mobility  Bed Mobility Overal bed mobility: Needs Assistance Bed Mobility: Supine to Sit     Supine to sit: Min assist;HOB elevated     General bed mobility comments: able to scoot in supine to EOB with cues; vc for technique to sit EOB;   Transfers Overall transfer level: Needs assistance Equipment used: Rolling walker (2 wheeled) Transfers: Sit to/from Stand Sit to Stand: Min assist         General transfer comment: vc for safe use of RW and pt unable to follow instructions  Ambulation/Gait Ambulation/Gait assistance: Min assist;+2 safety/equipment Ambulation Distance (Feet): 6 Feet Assistive device: Rolling walker (2 wheeled) Gait Pattern/deviations: Step-to pattern;Decreased stride length;Antalgic     General Gait Details: very small steps; pt instinctively leads with RLE and bears weight on UEs via RW; chair pulled up behind pt to sit due to fatigue  Stairs            Wheelchair Mobility    Modified Rankin (Stroke Patients Only)       Balance Overall balance assessment: History of Falls                                            Pertinent Vitals/Pain Pain Assessment: Faces Faces Pain Scale: Hurts little more Pain Location: Rt hip Pain Intervention(s): Limited activity within patient's tolerance;Monitored during session;Repositioned    Home Living Family/patient expects to be discharged to:: Skilled nursing facility                 Additional Comments: lived at home with grandson    Prior Function Level of Independence: Needs assistance   Gait / Transfers Assistance Needed: per chart, 5 falls in past 12 mo           Hand Dominance        Extremity/Trunk Assessment   Upper Extremity Assessment: Overall WFL for tasks assessed;Defer to OT evaluation (triceps at least 4/5)           Lower Extremity Assessment: RLE deficits/detail RLE Deficits / Details: AAROM to 90    Cervical / Trunk Assessment: Kyphotic (slight)  Communication   Communication: No difficulties  Cognition Arousal/Alertness: Awake/alert Behavior During Therapy: WFL for tasks assessed/performed Overall Cognitive Status: No family/caregiver present to determine baseline cognitive functioning (confused, pleasantly)                      General Comments      Exercises General Exercises - Lower Extremity Heel Slides: AAROM;Right;5 reps  Assessment/Plan    PT Assessment Patient needs continued PT services  PT Diagnosis Difficulty walking;Acute pain   PT Problem List Decreased strength;Decreased range of motion;Decreased activity tolerance;Decreased balance;Decreased mobility;Decreased cognition;Decreased knowledge of use of DME;Decreased safety awareness;Pain  PT Treatment Interventions DME instruction;Gait training;Functional mobility training;Therapeutic activities;Therapeutic exercise;Balance training;Cognitive remediation;Patient/family education   PT Goals (Current goals can be found in the Care Plan section) Acute Rehab PT Goals Patient Stated  Goal: unable to state; agrees she wants to get OOB PT Goal Formulation: Patient unable to participate in goal setting Time For Goal Achievement: 09/21/15 Potential to Achieve Goals: Fair (depending on cognitive improvement)    Frequency Min 3X/week   Barriers to discharge Decreased caregiver support      Co-evaluation               End of Session Equipment Utilized During Treatment: Gait belt Activity Tolerance: Patient limited by fatigue Patient left: in chair;with call bell/phone within reach;with chair alarm set;with nursing/sitter in room Nurse Communication: Mobility status;Weight bearing status         Time: 5621-3086 PT Time Calculation (min) (ACUTE ONLY): 14 min   Charges:   PT Evaluation $Initial PT Evaluation Tier I: 1 Procedure     PT G Codes:        Imogine Carvell 2015-09-11, 10:12 AM Pager [phone]

## 2015-09-07 NOTE — Progress Notes (Signed)
Family Medicine Teaching Service Daily Progress Note Intern Pager: [phone]  Patient name: Nancy Park Medical record number: [id number] Date of birth: 06/17/1926 Age: 81 y.o. Gender: female  Primary Care Provider: Palma Holter, MD Consultants: ortho Code Status: DNR  Pt Overview and Major Events to Date:  10/10-admitted with right femoral fracture. ORIF  Assessment and Plan: Nancy Park is a 81 y.o. female presenting with right hip fracture after fall at home now s/p ORIF on 09/06/2015.  PMH is significant for hypertension & CKD-3 and osteopnea.  Right femoral neck fracture: from mechanical fall in the setting of new environment/home.DG femur with Impacted fracture at the base of the right femoral neck with extension to the lesser trochanter. S/p ORIF on 09/06/2015. Uncomplicated post-surgical course so far. Not in a lot of pain despite not taking pain meds overnight. CBC 9.1. 11 on admission.  -admit to FPTS. Attedning Dr. Pollie Meyer -s/p ORIF -f/u Ortho recs  -WBAT -Scheduled 1/2 Norco q6h for good pain control -has prn meds for severe pain. -f/u post-surgical CBC and BMP -f/u mental status while on opiates -PT/OT today  Hypertension: no lisinopril 10 mg at home. Normotensive except for some hypotension in OR. No cardiac hx. Last Echo 2012 with EF of 60-65%, mild AV regurg. -Hold home lisinopril for now  CKD-3: sCr 0.84 (0.90 on admission). Stable  Osteopnea: no formal bone scan. -resume home calcium and vitamin D  Dementia: appears appropriate. No change from yesterday. Awake. Oriented to self, month, place and person -consider formal mental status exam (MOCA)  FEN/GI:  -IVF NS 75 ml/hr. KVO when she takes good PO -regular diet  Prophylaxis: -start Lovenox at 30 mg daily  Dispo: floor. Will go to SNF on discharge  Subjective:  Reports sleeping well overnight. Endorses some pain over surgical areas. Hasn't asked for any pain meds over ngiht. Says  hospital gown is big for her and likes her son or daughter to bring her clothes.   Objective: Temp:  [97.7 F (36.5 C)-99.5 F (37.5 C)] 99.5 F (37.5 C) (10/11 0620) Pulse Rate:  [66-87] 72 (10/11 0620) Resp:  [12-16] 15 (10/11 0620) BP: (106-164)/(33-97) 118/43 mmHg (10/11 0620) SpO2:  [95 %-100 %] 96 % (10/11 1478)   Physical Exam: Gen: NAD, well-appearing  CV: RRR. S1 & S2 audible, 2/6 systolic murmurs over RUSB Chest: no tenderness to palpation Resp: no apparent WOB, CTAB. Abd: +BS. Soft, NDNT, no rebound or guarding.  Ext: appears symmetric, no edema or gross deformities. Surgical wound: dressing in place, some blood stain, not soaked, no swelling, some echymosis Neuro: Alert, oriented to self, place, person & month. Not able to tell year. No gross focal deficits  Laboratory:  Recent Labs Lab 09/06/15 0534 09/06/15 0539 09/06/15 0816 09/07/15 1029  WBC 6.7  --  9.1 6.8  HGB 11.0* 11.9* 11.2* 9.1*  HCT 33.1* 35.0* 33.5* 27.2*  PLT 257  --  237 201    Recent Labs Lab 09/06/15 0539 09/06/15 0816 09/07/15 1029  NA 140 138 136  K 4.3 4.4 3.7  CL 104 104 104  CO2  --  26 24  BUN CREATININE 0.90 0.76 0.84  CALCIUM  --  9.0 8.2*  GLUCOSE 108* 116* 148*    Imaging/Diagnostic Tests: Dg C-arm 1-60 Min  09/06/2015   CLINICAL DATA:  Operative imaging from pinning of a right proximal femur fracture.  EXAM: DG C-ARM 61-120 MIN; RIGHT FEMUR 2 VIEWS  COMPARISON:  09/06/2015  FINDINGS: Images show placement of 2 screws through the proximal aspect of an intra medullary rod reducing the fracture components into anatomic alignment. There is no new fracture. The orthopedic hardware is well-seated. Right hip joint is normally aligned. No evidence of an operative complication.  IMPRESSION: Well aligned right proximal femur fracture components following ORIF.   Electronically Signed   By: Amie Portland M.D.   On: 09/06/2015 21:29   Dg Femur, Min 2 Views  Right  09/06/2015   CLINICAL DATA:  Operative imaging from pinning of a right proximal femur fracture.  EXAM: DG C-ARM 61-120 MIN; RIGHT FEMUR 2 VIEWS  COMPARISON:  09/06/2015  FINDINGS: Images show placement of 2 screws through the proximal aspect of an intra medullary rod reducing the fracture components into anatomic alignment. There is no new fracture. The orthopedic hardware is well-seated. Right hip joint is normally aligned. No evidence of an operative complication.  IMPRESSION: Well aligned right proximal femur fracture components following ORIF.   Electronically Signed   By: Amie Portland M.D.   On: 09/06/2015 21:29    Almon Hercules, MD 09/07/2015, 11:32 AM PGY-1, [facility] FPTS Intern pager: [phone], text pages welcome

## 2015-09-08 LAB — CBC
HEMATOCRIT: 26.6 % — AB (ref 36.0–46.0)
Hemoglobin: 8.9 g/dL — ABNORMAL LOW (ref 12.0–15.0)
MCH: 32.7 pg (ref 26.0–34.0)
MCHC: 33.5 g/dL (ref 30.0–36.0)
MCV: 97.8 fL (ref 78.0–100.0)
Platelets: 197 10*3/uL (ref 150–400)
RBC: 2.72 MIL/uL — ABNORMAL LOW (ref 3.87–5.11)
RDW: 12.3 % (ref 11.5–15.5)
WBC: 6.8 10*3/uL (ref 4.0–10.5)

## 2015-09-08 MED ORDER — POLYETHYLENE GLYCOL 3350 17 G PO PACK
17.0000 g | PACK | Freq: Every day | ORAL | Status: DC
  Administered 2015-09-09: 17 g via ORAL
  Filled 2015-09-08: qty 1

## 2015-09-08 NOTE — Clinical Social Work Note (Signed)
Clinical Social Work Assessment  Patient Details  Name: Nancy Park MRN: [id number] Date of Birth: [birth year]  Date of referral:  09/07/15               Reason for consult:  Facility Placement                Permission sought to share information with:  Family Supports Permission granted to share information::  Yes, Verbal Permission Granted  Name::     Patient's daughter Greggory Keen, and her son.  Agency::  SNF admissions  Relationship::     Contact Information:     Housing/Transportation Living arrangements for the past 2 months:  Single Family Home Source of Information:  Patient, Adult Children Patient Interpreter Needed:  None Criminal Activity/Legal Involvement Pertinent to Current Situation/Hospitalization:  No - Comment as needed Significant Relationships:  Adult Children Lives with:  Adult Children Do you feel safe going back to the place where you live?  No (Patient's family and her agree she needs some short term rehab to get her strength back up in order to return back home.) Need for family participation in patient care:  Yes (Comment) (Patient has mild dementia and gets confused at times.)  Care giving concerns: Patient lives with her grandkids, but her kids are concerned that she may need some extra support and possible long term placement due to her dementia.   Social Worker assessment / plan:  Patient is an 81 year old female who lives with her grandkids in [place].  Patient is alert and oriented x2, and has mild dementia.  Patient was not very talkative but gave permission for CSW to speak with her adult children to work on discharge planning for patient to go to SNF for short term rehab.  Patient's family is concerned that her dementia is getting worse and she has not been as talkative as she used to be.  Patient's kids expressed sadness that her memory loss seems to have been getting worse.  Patient's family are thinking she may need to move  into an assisted living facility or a SNF for long term placement once she has received some short term rehab.  CSW explained to patient and her family the process of SNF placement and what to expect once she goes there.  Patient's family expressed they were grateful for information given.  Patient's family did not express any other questions.  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  Patient/Family's Understanding of and Emotional Response to Diagnosis, Current Treatment, and Prognosis:  Patient's family expressed they do not know much about dementia and memory loss, but were given information about Alzheimer's association to learn more about disease process.  Patient's family are aware of current treatment plan for short term rehab and understand what to expect.  Emotional Assessment Appearance:  Appears stated age Attitude/Demeanor/Rapport:    Affect (typically observed):  Calm, Stable, Pleasant, Appropriate Orientation:  Oriented to Self, Oriented to Place Alcohol / Substance use:  Not Applicable Psych involvement (Current and /or in the community):  No (Comment)  Discharge Needs  Concerns to be addressed:  Lack of Support Readmission within the last 30 days:  No Current discharge risk:  Cognitively  Impaired Barriers to Discharge:  No Barriers Identified   [place] [employer], Axton Cihlar R, LCSWA 09/08/2015, 6:16 PM

## 2015-09-08 NOTE — Discharge Instructions (Signed)
It is nice taking care of you! Your were admitted with right leg bone fracture after fall at home. The fracture has been fixed by surgery.  Things you can do:  put full weight as tolerated on your right hip.   get up only with assistance for some times.   Get the incision wet when you shower   apply new dry dressing daily as needed.  Take medications as prescribed or directed  Things to watch:  Too much drainage or pus from incision site  Fever and chills  Active bleeding  Worsening pain Please seek immediate medical care for one or more of the above symptoms.  We are discharging you with some additional medication in addition to your old medications. The name and direction on how to take them are listed under medication section.  You have a follow up with your surgeon as well as your primary care doctor. The address, date and time are listed under follow up section.  Again, it has been pleasure taking care of you!  Take care,

## 2015-09-08 NOTE — Clinical Social Work Note (Signed)
Patient and family were given bed offers and patient's family choose [facility].  [facility], patient has a bed available tomorrow if she is medically ready for discharge and orders have been received.  Ervin KnackEric R. Ellery Meroney, MSW, Theresia MajorsLCSWA [phone] 09/08/2015 6:11 PM

## 2015-09-08 NOTE — Clinical Social Work Placement (Addendum)
   CLINICAL SOCIAL WORK PLACEMENT  NOTE  Date:  09/08/2015  Patient Details  Name: Nancy ShiversDorothy E Lorenzi MRN: [id number] Date of Birth: 11/20/1926  Clinical Social Work is seeking post-discharge placement for this patient at the Skilled  Nursing Facility level of care (*CSW will initial, date and re-position this form in  chart as items are completed):  Yes   Patient/family provided with [facility] Clinical Social Work Department's list of facilities offering this level of care within the geographic area requested by the patient (or if unable, by the patient's family).  Yes   Patient/family informed of their freedom to choose among providers that offer the needed level of care, that participate in Medicare, Medicaid or managed care program needed by the patient, have an available bed and are willing to accept the patient.  Yes   Patient/family informed of [name]'s ownership interest in [facility] and [facility], as well as of the fact that they are under no obligation to receive care at these facilities.  PASRR submitted to EDS on 09/08/15     PASRR number received on 09/08/15     Existing PASRR number confirmed on       FL2 transmitted to all facilities in geographic area requested by pt/family on 09/08/15     FL2 transmitted to all facilities within larger geographic area on       Patient informed that his/her managed care company has contracts with or will negotiate with certain facilities, including the following:        Yes   Patient/family informed of bed offers received.  [facility]     Physician recommends and patient chooses bed at      Patient to be transferred to  [facility] on  09/09/15.  Patient to be transferred to facility by  [facility]     Patient family notified on  09/09/15 of transfer.  Name of family member notified:   Elease Hashimotoatricia patient's daughter and Veverly FellsChristopher Whitmeire patient's grandson     PHYSICIAN Please sign  FL2     Additional Comment:    _______________________________________________ Ervin KnackEric R. Kimetha Trulson, MSW, Theresia MajorsLCSWA [phone] 09/09/2015 5:53 PM

## 2015-09-08 NOTE — Progress Notes (Signed)
Family Medicine Teaching Service Daily Progress Note Intern Pager: [phone]  Patient name: Nancy Park Abaya Medical record number: [id number] Date of birth: 07/04/1926 Age: 81 y.o. Gender: female  Primary Care Provider: Palma HolterKanishka G Gunadasa, MD Consultants: ortho Code Status: DNR  Pt Overview and Major Events to Date:  10/10-admitted with right femoral fracture. ORIF  Assessment and Plan: Nancy Park Araiza is a 81 y.o. female presenting with right hip fracture after fall at home now s/p ORIF on 09/06/2015.  PMH is significant for hypertension & CKD-3 and osteopnea.  Right femoral neck fracture: from mechanical fall in the setting of new environment/home.DG femur with Impacted fracture at the base of the right femoral neck with extension to the lesser trochanter. S/p ORIF on 09/06/2015. Uncomplicated post-surgical course so far. Not in a lot of pain despite not taking pain meds overnight. CBC stable (11>9.1>8.9) -admit to FPTS. Attedning Dr. Pollie MeyerMcIntyre -s/p ORIF -f/u Ortho recs  -WBAT -Scheduled 1/2 Norco q6h for good pain control -has prn meds for severe pain. -f/u mental status while on opiates -PT/OT today  Hypertension: no lisinopril 10 mg at home. Normotensive except for some hypotension in OR. No cardiac hx. Last Echo 2012 with EF of 60-65%, mild AV regurg. -Hold home lisinopril for now  CKD-3: sCr 0.84 (0.90 on admission). Stable  Osteopnea: no formal bone scan. -resume home calcium and vitamin D  Dementia: appears appropriate. No change from yesterday. Awake. Oriented to self, month, year, place and person -consider formal mental status exam (MOCA)  FEN/GI:  -KVO when she takes good PO -regular diet  Prophylaxis: -ASA 325 per ortho.  Dispo: floor. Awaiting SNF placement. SW on board.  Subjective:  Reports pain with walking. Her pain is 0/10 when she lies in bed. Eating well.   Objective: Temp:  [99 F (37.2 C)-101 F (38.3 C)] 99 F (37.2 C) (10/12 0444) Pulse  Rate:  [76-82] 78 (10/12 0444) Resp:  [16] 16 (10/12 0444) BP: (120-153)/(39-45) 122/45 mmHg (10/12 0444) SpO2:  [94 %-95 %] 94 % (10/12 0444)   Physical Exam: Gen: NAD, well-appearing  CV: RRR. S1 & S2 audible, 2/6 systolic murmurs over RUSB Chest: no tenderness to palpation Resp: no apparent WOB, CTAB. Abd: +BS. Soft, NDNT, no rebound or guarding.  Ext: appears symmetric, no edema or gross deformities. Surgical wound: dressing in place, some blood stain, not soaked, no swelling, some echymosis Neuro: Alert, oriented to self, place, person & month. Not able to tell year. No gross focal deficits  Laboratory:  Recent Labs Lab 09/06/15 0534 09/06/15 0539 09/06/15 0816 09/07/15 1029  WBC 6.7  --  9.1 6.8  HGB 11.0* 11.9* 11.2* 9.1*  HCT 33.1* 35.0* 33.5* 27.2*  PLT 257  --  237 201    Recent Labs Lab 09/06/15 0539 09/06/15 0816 09/07/15 1029  NA 140 138 136  K 4.3 4.4 3.7  CL 104 104 104  CO2  --  26 24  BUN 19 15 11   CREATININE 0.90 0.76 0.84  CALCIUM  --  9.0 8.2*  GLUCOSE 108* 116* 148*    Imaging/Diagnostic Tests: No results found.  Almon Herculesaye T Gonfa, MD 09/08/2015, 7:18 AM PGY-1, [facility] FPTS Intern pager: [phone], text pages welcome

## 2015-09-09 DIAGNOSIS — G3183 Dementia with Lewy bodies: Secondary | ICD-10-CM

## 2015-09-09 DIAGNOSIS — F0280 Dementia in other diseases classified elsewhere without behavioral disturbance: Secondary | ICD-10-CM

## 2015-09-09 LAB — BASIC METABOLIC PANEL
ANION GAP: 5 (ref 5–15)
BUN: 10 mg/dL (ref 6–20)
CALCIUM: 8 mg/dL — AB (ref 8.9–10.3)
CO2: 27 mmol/L (ref 22–32)
Chloride: 104 mmol/L (ref 101–111)
Creatinine, Ser: 0.74 mg/dL (ref 0.44–1.00)
GFR calc Af Amer: >60 mL/min (ref >60)
GLUCOSE: 98 mg/dL (ref 65–99)
Potassium: 3.8 mmol/L (ref 3.5–5.1)
Sodium: 136 mmol/L (ref 135–145)

## 2015-09-09 LAB — CBC
HCT: 25.1 % — ABNORMAL LOW (ref 36.0–46.0)
HEMATOCRIT: 26.1 % — AB (ref 36.0–46.0)
HEMOGLOBIN: 8.6 g/dL — AB (ref 12.0–15.0)
Hemoglobin: 8.2 g/dL — ABNORMAL LOW (ref 12.0–15.0)
MCH: 31.9 pg (ref 26.0–34.0)
MCH: 32.2 pg (ref 26.0–34.0)
MCHC: 32.7 g/dL (ref 30.0–36.0)
MCHC: 33 g/dL (ref 30.0–36.0)
MCV: 97.7 fL (ref 78.0–100.0)
MCV: 97.8 fL (ref 78.0–100.0)
PLATELETS: 190 10*3/uL (ref 150–400)
Platelets: 232 10*3/uL (ref 150–400)
RBC: 2.57 MIL/uL — ABNORMAL LOW (ref 3.87–5.11)
RBC: 2.67 MIL/uL — ABNORMAL LOW (ref 3.87–5.11)
RDW: 12.3 % (ref 11.5–15.5)
RDW: 12.5 % (ref 11.5–15.5)
WBC: 7.1 10*3/uL (ref 4.0–10.5)
WBC: 6.7 10*3/uL (ref 4.0–10.5)

## 2015-09-09 MED ORDER — LISINOPRIL 10 MG PO TABS
10.0000 mg | ORAL_TABLET | Freq: Every day | ORAL | Status: DC
  Administered 2015-09-09: 10 mg via ORAL
  Filled 2015-09-09: qty 1

## 2015-09-09 MED ORDER — ASPIRIN 325 MG PO TBEC: 325.0000 mg | DELAYED_RELEASE_TABLET | Freq: Every day | ORAL | Status: AC

## 2015-09-09 MED ORDER — POLYETHYLENE GLYCOL 3350 17 G PO PACK: 17.0000 g | PACK | Freq: Every day | ORAL | Status: AC

## 2015-09-09 MED ORDER — HYDROCODONE-ACETAMINOPHEN 5-325 MG PO TABS: 1.0000 | ORAL_TABLET | Freq: Four times a day (QID) | ORAL | Status: AC | PRN

## 2015-09-09 MED ORDER — ASPIRIN 325 MG PO TBEC: 325.0000 mg | DELAYED_RELEASE_TABLET | Freq: Two times a day (BID) | ORAL | Status: DC

## 2015-09-09 MED ORDER — SENNA 8.6 MG PO TABS: 1.0000 | ORAL_TABLET | Freq: Every day | ORAL | Status: AC

## 2015-09-09 MED ORDER — HYDROCODONE-ACETAMINOPHEN 5-325 MG PO TABS: 0.5000 | ORAL_TABLET | Freq: Four times a day (QID) | ORAL | Status: DC

## 2015-09-09 NOTE — Clinical Social Work Note (Signed)
Patient to be d/c'ed today to [facility].   Patient and family agreeable to plans will transport via ems RN to call report.  Kenyata Guess, MSW, LCSWA [phone]  

## 2015-09-09 NOTE — Progress Notes (Signed)
Patient ID: Nancy Park, female   DOB: 04/06/1926, 81 y.o.   MRN: [id number] No acute changes from ortho standpoint.  Right hip stable.  Dressing clean and dry.  Can discharge from ortho standpoint as well.  Only aspirin 325 mg twice daily with meals for DVT coverage.

## 2015-09-09 NOTE — Clinical Social Work Note (Signed)
Patient's grandson contacted CSW to express he was interested in having patient go to a different SNF then the one the family had originally chosen and completed paperwork.  CSW informed him that since the [facility] is [facility] he will have to follow up with [facility] and a different facility to make arrangements.  Patient's grandson was given contact information for [facility].  Ervin Knack.  Jacquel Redditt R. Anuja Manka, MSW, Theresia MajorsLCSWA [phone] 09/09/2015 6:21 PM

## 2015-09-09 NOTE — Progress Notes (Signed)
Occupational Therapy Treatment Patient Details Name: Nancy Park MRN: [id number] DOB: Apr 23, 1926 Today's Date: 09/09/2015    History of present illness 81 y.o. female presenting with right hip fracture after fall at home. PMH is significant for hypertension & CKD-3 and osteopnea   OT comments  Pt tolerated session well and very motivated this session. Pt requiring verbal cues for safe advancement of RW and hand placement during transfers. Pt continues to benefit from OT intervention.   Follow Up Recommendations  SNF;Supervision/Assistance - 24 hour    Equipment Recommendations  Other (comment) (TBD next venue)    Recommendations for Other Services      Precautions / Restrictions Precautions Precautions: Fall Restrictions Weight Bearing Restrictions: No RLE Weight Bearing: Weight bearing as tolerated       Mobility Bed Mobility               General bed mobility comments: Pt OOB in recliner  Transfers Overall transfer level: Needs assistance Equipment used: Rolling walker (2 wheeled) Transfers: Sit to/from Stand Sit to Stand: Min assist         General transfer comment: Verbal cues for safety and hand placement.    Balance Overall balance assessment: History of Falls         Standing balance support: During functional activity;No upper extremity supported Standing balance-Leahy Scale: Poor                     ADL Overall ADL's : Needs assistance/impaired                         Toilet Transfer: Minimal Doctor, hospital and Hygiene: Min guard;Sit to/from stand       Functional mobility during ADLs: Minimal assistance;Rolling walker General ADL Comments: Pt seated in recliner chair upon entering the room. Pt required min assist to stand from recliner chair and ambulating 15' with RW and min A into bathroom .Pt performed toilet transfer onto standard height toilet with min  verbal cues for RW advancement and hand placement. Pt able to successfully void and required  min A for balance when standing for hygiene and clothing management. Pt standing at sink side and washing hands with min A for balance as well and then returning to recliner chair in similar manner as stated .                Cognition   Behavior During Therapy: WFL for tasks assessed/performed Overall Cognitive Status: Within Functional Limits for tasks assessed                                    Pertinent Vitals/ Pain       Pain Assessment: Faces Pain Score: 4  Faces Pain Scale: Hurts little more Pain Location: R hip Pain Descriptors / Indicators: Aching;Tender;Guarding Pain Intervention(s): Repositioned         Frequency Min 2X/week     Progress Toward Goals  OT Goals(current goals can now be found in the care plan section)  Progress towards OT goals: Progressing toward goals     Plan Discharge plan remains appropriate       End of Session Equipment Utilized During Treatment: Rolling walker   Activity Tolerance Patient tolerated treatment well   Patient Left in chair;with call bell/phone within reach;with chair alarm set;with family/visitor present   Nurse Communication  Time: [id number] OT Time Calculation (min): 16 min  Charges: OT Treatments $Self Care/Home Management : 8-22 mins  Lowella Gripittman, Laylee Schooley L, MS, OTR/L 09/09/2015, 3:50 PM

## 2015-09-09 NOTE — Care Management Important Message (Signed)
Important Message  Patient Details  Name: Nancy Park MRN: [id number] Date of Birth: 04/01/1926   Medicare Important Message Given:  Yes-second notification given    Kyla BalzarineShealy, Kaprice Kage Abena 09/09/2015, 11:00 AM

## 2015-09-09 NOTE — Progress Notes (Signed)
Physical Therapy Treatment Patient Details Name: Nancy Park MRN: [id number] DOB: 12/15/1925 Today's Date: 09/09/2015    History of Present Illness 81 y.o. female presenting with right hip fracture after fall at home. PMH is significant for hypertension & CKD-3 and osteopnea    PT Comments    POD # 3 pt OOB in recliner feeling "better".  Assisted with amb a limited distance then performed some LE TE's followed by ICE.    Follow Up Recommendations  SNF [facility])     Equipment Recommendations       Recommendations for Other Services       Precautions / Restrictions Precautions Precautions: Fall Restrictions Weight Bearing Restrictions: No RLE Weight Bearing: Weight bearing as tolerated    Mobility  Bed Mobility               General bed mobility comments: Pt OOB in recliner  Transfers Overall transfer level: Needs assistance Equipment used: Rolling walker (2 wheeled) Transfers: Sit to/from Stand Sit to Stand: Min assist         General transfer comment: Verbal cues for safety and hand placement.  Ambulation/Gait Ambulation/Gait assistance: Min assist Ambulation Distance (Feet): 14 Feet Assistive device: Rolling walker (2 wheeled) Gait Pattern/deviations: Step-to pattern;Decreased stance time - right Gait velocity: decreased   General Gait Details: 50% VC's on proper sequencing and proper walker to self distance.  Limited activity tolerance.   Stairs            Wheelchair Mobility    Modified Rankin (Stroke Patients Only)       Balance                                    Cognition Arousal/Alertness: Awake/alert Behavior During Therapy: WFL for tasks assessed/performed Overall Cognitive Status: Within Functional Limits for tasks assessed                      Exercises  10 reps B AP 10 reps B knee presses 10 reps R LE AAROM HS 10 reps R LE AAROM ABD    General Comments        Pertinent Vitals/Pain  Pain Assessment: 0-10 Pain Score: 4  Pain Location: R hip Pain Descriptors / Indicators: Grimacing;Sore Pain Intervention(s): Repositioned;Ice applied    Home Living                      Prior Function            PT Goals (current goals can now be found in the care plan section) Progress towards PT goals: Progressing toward goals    Frequency  Min 3X/week    PT Plan      Co-evaluation             End of Session Equipment Utilized During Treatment: Gait belt Activity Tolerance: Patient limited by fatigue;No increased pain Patient left: in chair;with call bell/phone within reach;with chair alarm set;with nursing/sitter in room     Time: 1135-1150 PT Time Calculation (min) (ACUTE ONLY): 15 min  Charges:  $Gait Training: 8-22 mins                    G Codes:      Nancy ReichertKropski, Nancy Park 09/09/2015, 1:02 PM

## 2015-09-19 ENCOUNTER — Emergency Department (HOSPITAL_COMMUNITY)
Admission: EM | Admit: 2015-09-19 | Discharge: 2015-09-19 | Disposition: A | Discharge status: Home / Self Care | Payer: Medicare Other | Attending: Emergency Medicine | Admitting: Emergency Medicine

## 2015-09-19 ENCOUNTER — Emergency Department (HOSPITAL_COMMUNITY): Payer: Medicare Other

## 2015-09-19 ENCOUNTER — Encounter (HOSPITAL_COMMUNITY): Payer: Self-pay | Admitting: Emergency Medicine

## 2015-09-19 DIAGNOSIS — Z8781 Personal history of (healed) traumatic fracture: Secondary | ICD-10-CM | POA: Diagnosis not present

## 2015-09-19 DIAGNOSIS — I10 Essential (primary) hypertension: Secondary | ICD-10-CM | POA: Insufficient documentation

## 2015-09-19 DIAGNOSIS — S0083XA Contusion of other part of head, initial encounter: Secondary | ICD-10-CM | POA: Diagnosis not present

## 2015-09-19 DIAGNOSIS — Y92129 Unspecified place in nursing home as the place of occurrence of the external cause: Secondary | ICD-10-CM | POA: Insufficient documentation

## 2015-09-19 DIAGNOSIS — Z7982 Long term (current) use of aspirin: Secondary | ICD-10-CM | POA: Diagnosis not present

## 2015-09-19 DIAGNOSIS — F329 Major depressive disorder, single episode, unspecified: Secondary | ICD-10-CM | POA: Diagnosis not present

## 2015-09-19 DIAGNOSIS — W01198A Fall on same level from slipping, tripping and stumbling with subsequent striking against other object, initial encounter: Secondary | ICD-10-CM | POA: Diagnosis not present

## 2015-09-19 DIAGNOSIS — F0390 Unspecified dementia without behavioral disturbance: Secondary | ICD-10-CM | POA: Insufficient documentation

## 2015-09-19 DIAGNOSIS — Z7951 Long term (current) use of inhaled steroids: Secondary | ICD-10-CM | POA: Insufficient documentation

## 2015-09-19 DIAGNOSIS — Y9389 Activity, other specified: Secondary | ICD-10-CM | POA: Diagnosis not present

## 2015-09-19 DIAGNOSIS — W19XXXA Unspecified fall, initial encounter: Secondary | ICD-10-CM

## 2015-09-19 DIAGNOSIS — Y998 Other external cause status: Secondary | ICD-10-CM | POA: Insufficient documentation

## 2015-09-19 DIAGNOSIS — S0990XA Unspecified injury of head, initial encounter: Secondary | ICD-10-CM | POA: Diagnosis present

## 2015-09-19 HISTORY — DX: Constipation, unspecified: K59.00

## 2015-09-19 HISTORY — DX: Age-related osteoporosis without current pathological fracture: M81.0

## 2015-09-19 HISTORY — DX: Repeated falls: R29.6

## 2015-09-19 HISTORY — DX: Chronic kidney disease, unspecified: N18.9

## 2015-09-19 HISTORY — DX: Unspecified dementia, unspecified severity, without behavioral disturbance, psychotic disturbance, mood disturbance, and anxiety: F03.90

## 2015-09-19 NOTE — ED Provider Notes (Signed)
CSN: [id number]     Arrival date & time 09/19/15  4098 History  By signing my name below, I, [name], attest that this documentation has been prepared under the direction and in the presence of [name], MD. Electronically Signed: [name], ED Scribe. 09/19/2015. 3:42 AM.   Chief Complaint  Patient presents with  . Fall   The history is provided by the EMS personnel and the nursing home. No language interpreter was used.  HPI Comments (Level 5 Caveat due to dementia): Nancy Park is a 81 y.o. Female with a hx of HTN, brain aneurysm, osteoporosis and falls brought in by EMS who presents to the Emergency Department complaining of a fall onset PTA. Pt is from [facility] nursing home. Pt was found on floor by staff and had an unwitnessed fall. Pt arrives to the ED in a c-collar. Pt is oriented to place and person, but not time. She states, "I'm not sure what happened, but I think I had an accident. I must have had a fall."  Pt recently had hip surgery on 09/07/15 following a fall on 09/06/15.  Past Medical History  Diagnosis Date  . Depression   . Fracture of arm 2000    right  . Hypertension   . Osteoporosis   . Brain aneurysm    Past Surgical History  Procedure Laterality Date  . Intramedullary (im) nail intertrochanteric Right 09/06/2015    Procedure: INTRAMEDULLARY (IM) NAIL INTERTROCHANTRIC;  Surgeon: Kathryne Hitch, MD;  Location: [facility] OR;  Service: Orthopedics;  Laterality: Right;   Family History  Problem Relation Age of Onset  . Diabetes Mother   . Cancer Mother   . Goiter Mother   . Stroke Father   . Cancer Sister 23    breast  . Cancer Brother     skin   Social History  Substance Use Topics  . Smoking status: Never Smoker   . Smokeless tobacco: Never Used  . Alcohol Use: No   OB History    Gravida Para Term Preterm AB TAB SAB Ectopic Multiple Living   3              Review of Systems  Unable to perform ROS: Dementia  All other systems  reviewed and are negative.  Allergies  Tramadol  Home Medications   Prior to Admission medications   Medication Sig Start Date End Date Taking? Authorizing Provider  aspirin 325 MG EC tablet Take 1 tablet (325 mg total) by mouth daily. With meals. 09/09/15   Almon Hercules, MD  Calcium Carbonate-Vitamin D (CALCIUM 600-D) 600-400 MG-UNIT per tablet Take 1 tablet by mouth 2 (two) times daily. 03/08/15   Glori Luis, MD  docusate sodium (COLACE) 100 MG capsule Take 1 capsule (100 mg total) by mouth daily. 06/29/15   Alyssa A Haney, MD  fluticasone (FLONASE) 50 MCG/ACT nasal spray Place 1 spray into the nose daily. 08/14/13   Amber Nydia Bouton, MD  HYDROcodone-acetaminophen (NORCO/VICODIN) 5-325 MG tablet Take 1 tablet by mouth every 6 (six) hours as needed for moderate pain. 09/07/15   Kathryne Hitch, MD  HYDROcodone-acetaminophen (NORCO/VICODIN) 5-325 MG tablet Take 1-2 tablets by mouth every 6 (six) hours as needed for moderate pain. 09/09/15   Almon Hercules, MD  lisinopril (PRINIVIL,ZESTRIL) 10 MG tablet Take 1 tablet (10 mg total) by mouth daily. 02/11/15 02/11/16  Glori Luis, MD  Multiple Vitamins-Minerals (MULTIVITAMIN WITH MINERALS) tablet Take 1 tablet by mouth daily.  Historical Provider, MD  polyethylene glycol (MIRALAX / GLYCOLAX) packet Take 17 g by mouth daily. As needed for constipation 09/09/15   Almon Hercules, MD  senna (SENOKOT) 8.6 MG TABS tablet Take 1 tablet (8.6 mg total) by mouth at bedtime. 09/09/15   Almon Hercules, MD   BP 164/55 mmHg  Pulse 84  Temp(Src) 98.9 F (37.2 C) (Oral)  Resp 15  SpO2 100% Physical Exam  Constitutional: She appears well-developed and well-nourished.  HENT:  Head: Normocephalic.  Ecchymosis left side of forehead; neck is immobilized in C-Collar and non-tender  Eyes: EOM are normal. Pupils are equal, round, and reactive to light.  Neck: No JVD present.  Cardiovascular: Normal rate, regular rhythm and normal heart sounds.  Exam  reveals no gallop and no friction rub.   No murmur heard. Pulmonary/Chest: Effort normal and breath sounds normal. She has no wheezes. She has no rales. She exhibits no tenderness.  Abdominal: Soft. Bowel sounds are normal. She exhibits no distension and no mass. There is no tenderness.  Musculoskeletal: Normal range of motion. She exhibits no edema.  Incision right hip with staples present, no sign of infection  Lymphadenopathy:    She has no cervical adenopathy.  Neurological: She is alert. No cranial nerve deficit. She exhibits normal muscle tone. Coordination normal.  Oriented to person and place, but not time. Moderate cogwheel rigidity present   Skin: Skin is warm and dry. No rash noted.  Psychiatric: She has a normal mood and affect. Her behavior is normal.  Nursing note and vitals reviewed.   ED Course  Procedures (including critical care time) DIAGNOSTIC STUDIES: Oxygen Saturation is 100% on RA, normal by my interpretation.    COORDINATION OF CARE: 3:42 AM-CT C-spine and head  Imaging Review Ct Head Wo Contrast  09/19/2015  CLINICAL DATA:  Found on floor. Unwitnessed fall. Left forehead hematoma. Concern for head or cervical spine injury. Initial encounter. EXAM: CT HEAD WITHOUT CONTRAST CT CERVICAL SPINE WITHOUT CONTRAST TECHNIQUE: Multidetector CT imaging of the head and cervical spine was performed following the standard protocol without intravenous contrast. Multiplanar CT image reconstructions of the cervical spine were also generated. COMPARISON:  CT of the head and cervical spine performed 09/06/2015 FINDINGS: CT HEAD FINDINGS There is no evidence of acute infarction, mass lesion, or intra- or extra-axial hemorrhage on CT. Prominence of the ventricles and sulci reflects mild cortical volume loss. Mild periventricular white matter change likely reflects small vessel ischemic microangiopathy. The brainstem and fourth ventricle are within normal limits. The basal ganglia are  unremarkable in appearance. The cerebral hemispheres demonstrate grossly normal gray-white differentiation. No mass effect or midline shift is seen. There is no evidence of fracture; visualized osseous structures are unremarkable in appearance. The orbits are within normal limits. The paranasal sinuses and mastoid air cells are well-aerated. Soft tissue swelling is noted overlying the left parietal calvarium. CT CERVICAL SPINE FINDINGS There is no evidence of fracture or subluxation. Multilevel disc space narrowing is noted along the cervical and upper thoracic spine, with scattered anterior and posterior disc osteophyte complexes. Mild facet disease is noted along left cervical spine. Vertebral bodies demonstrate normal height and alignment. Prevertebral soft tissues are within normal limits. The thyroid gland is unremarkable in appearance. The visualized lung apices are clear. A likely lymph node is noted at the right parotid gland. Calcification is noted at the carotid bifurcations bilaterally. IMPRESSION: 1. No evidence of traumatic intracranial injury or fracture. 2. No evidence of fracture or  subluxation along the cervical spine. 3. Soft swelling overlying the left parietal calvarium. 4. Mild cortical volume loss and scattered small vessel ischemic microangiopathy. 5. Mild degenerative change along the cervical and upper thoracic spine. 6. Calcification at the carotid bifurcations bilaterally. Carotid ultrasound would be helpful for further evaluation, when and as deemed clinically appropriate. Electronically Signed   By: Roanna RaiderJeffery  Chang M.D.   On: 09/19/2015 04:42   Ct Cervical Spine Wo Contrast  09/19/2015  CLINICAL DATA:  Found on floor. Unwitnessed fall. Left forehead hematoma. Concern for head or cervical spine injury. Initial encounter. EXAM: CT HEAD WITHOUT CONTRAST CT CERVICAL SPINE WITHOUT CONTRAST TECHNIQUE: Multidetector CT imaging of the head and cervical spine was performed following the  standard protocol without intravenous contrast. Multiplanar CT image reconstructions of the cervical spine were also generated. COMPARISON:  CT of the head and cervical spine performed 09/06/2015 FINDINGS: CT HEAD FINDINGS There is no evidence of acute infarction, mass lesion, or intra- or extra-axial hemorrhage on CT. Prominence of the ventricles and sulci reflects mild cortical volume loss. Mild periventricular white matter change likely reflects small vessel ischemic microangiopathy. The brainstem and fourth ventricle are within normal limits. The basal ganglia are unremarkable in appearance. The cerebral hemispheres demonstrate grossly normal gray-white differentiation. No mass effect or midline shift is seen. There is no evidence of fracture; visualized osseous structures are unremarkable in appearance. The orbits are within normal limits. The paranasal sinuses and mastoid air cells are well-aerated. Soft tissue swelling is noted overlying the left parietal calvarium. CT CERVICAL SPINE FINDINGS There is no evidence of fracture or subluxation. Multilevel disc space narrowing is noted along the cervical and upper thoracic spine, with scattered anterior and posterior disc osteophyte complexes. Mild facet disease is noted along left cervical spine. Vertebral bodies demonstrate normal height and alignment. Prevertebral soft tissues are within normal limits. The thyroid gland is unremarkable in appearance. The visualized lung apices are clear. A likely lymph node is noted at the right parotid gland. Calcification is noted at the carotid bifurcations bilaterally. IMPRESSION: 1. No evidence of traumatic intracranial injury or fracture. 2. No evidence of fracture or subluxation along the cervical spine. 3. Soft swelling overlying the left parietal calvarium. 4. Mild cortical volume loss and scattered small vessel ischemic microangiopathy. 5. Mild degenerative change along the cervical and upper thoracic spine. 6.  Calcification at the carotid bifurcations bilaterally. Carotid ultrasound would be helpful for further evaluation, when and as deemed clinically appropriate. Electronically Signed   By: Roanna RaiderJeffery  Chang M.D.   On: 09/19/2015 04:42   I have personally reviewed and evaluated these images as part of my medical decision-making.  MDM   Final diagnoses:  Fall at nursing home, initial encounter  Forehead contusion, initial encounter    Fall at nursing home with ecchymosis of forehead. I have reviewed her medication record and she is not on any anticoagulants. Old records were reviewed and she was recently [facility] following surgery for hip fracture. CT of head and cervical spine have been ordered.  CT scans are unremarkable. She is discharged back to her nursing home with instructions to follow-up with her PCP.  I, Muskan Bolla, personally performed the services described in this documentation. All medical record entries made by the scribe were at my direction and in my presence.  I have reviewed the chart and discharge instructions and agree that the record reflects my personal performance and is accurate and complete. Keionna Kinnaird.  09/19/2015. 4:00 AM.  [name], MD 09/19/15 [phone]

## 2015-09-19 NOTE — ED Notes (Signed)
[facility]

## 2015-09-19 NOTE — ED Notes (Signed)
Patient presents from Nsg home due to an unwitnessed fall.  Patient does not know what happened.  No bed sores noted.  Bruising to the left side of the forehead.  Staples in right hip intact and incision is well approximated.  Bruising noted to the backside of the right hip incision

## 2015-09-19 NOTE — ED Notes (Signed)
Patient transported to CT 

## 2015-09-19 NOTE — ED Notes (Signed)
[facility] , found on the floor by staff ( unwitnessed fall ) this morning , presents with left forehead hematoma /skin intact , pt. unable to recall incident - history of dementia .

## 2015-09-19 NOTE — ED Notes (Signed)
Patient attempting to take c collar off

## 2015-09-19 NOTE — ED Notes (Signed)
[facility].  Waiting for [facility] for transportation.

## 2015-09-19 NOTE — ED Notes (Signed)
[facility] contacted to tx patient back to [facility]

## 2015-09-19 NOTE — Discharge Instructions (Signed)
Fall Prevention in the Home  °Falls can cause injuries and can affect people from all age groups. There are many simple things that you can do to make your home safe and to help prevent falls. °WHAT CAN I DO ON THE OUTSIDE OF MY HOME? °· Regularly repair the edges of walkways and driveways and fix any cracks. °· Remove high doorway thresholds. °· Trim any shrubbery on the main path into your home. °· Use bright outdoor lighting. °· Clear walkways of debris and clutter, including tools and rocks. °· Regularly check that handrails are securely fastened and in good repair. Both sides of any steps should have handrails. °· Install guardrails along the edges of any raised decks or porches. °· Have leaves, snow, and ice cleared regularly. °· Use sand or salt on walkways during winter months. °· In the garage, clean up any spills right away, including grease or oil spills. °WHAT CAN I DO IN THE BATHROOM? °· Use night lights. °· Install grab bars by the toilet and in the tub and shower. Do not use towel bars as grab bars. °· Use non-skid mats or decals on the floor of the tub or shower. °· If you need to sit down while you are in the shower, use a plastic, non-slip stool.. °· Keep the floor dry. Immediately clean up any water that spills on the floor. °· Remove soap buildup in the tub or shower on a regular basis. °· Attach bath mats securely with double-sided non-slip rug tape. °· Remove throw rugs and other tripping hazards from the floor. °WHAT CAN I DO IN THE BEDROOM? °· Use night lights. °· Make sure that a bedside light is easy to reach. °· Do not use oversized bedding that drapes onto the floor. °· Have a firm chair that has side arms to use for getting dressed. °· Remove throw rugs and other tripping hazards from the floor. °WHAT CAN I DO IN THE KITCHEN?  °· Clean up any spills right away. °· Avoid walking on wet floors. °· Place frequently used items in easy-to-reach places. °· If you need to reach for something  above you, use a sturdy step stool that has a grab bar. °· Keep electrical cables out of the way. °· Do not use floor polish or wax that makes floors slippery. If you have to use wax, make sure that it is non-skid floor wax. °· Remove throw rugs and other tripping hazards from the floor. °WHAT CAN I DO IN THE STAIRWAYS? °· Do not leave any items on the stairs. °· Make sure that there are handrails on both sides of the stairs. Fix handrails that are broken or loose. Make sure that handrails are as long as the stairways. °· Check any carpeting to make sure that it is firmly attached to the stairs. Fix any carpet that is loose or worn. °· Avoid having throw rugs at the top or bottom of stairways, or secure the rugs with carpet tape to prevent them from moving. °· Make sure that you have a light switch at the top of the stairs and the bottom of the stairs. If you do not have them, have them installed. °WHAT ARE SOME OTHER FALL PREVENTION TIPS? °· Wear closed-toe shoes that fit well and support your feet. Wear shoes that have rubber soles or low heels. °· When you use a stepladder, make sure that it is completely opened and that the sides are firmly locked. Have someone hold the ladder while you   are using it. Do not climb a closed stepladder. °· Add color or contrast paint or tape to grab bars and handrails in your home. Place contrasting color strips on the first and last steps. °· Use mobility aids as needed, such as canes, walkers, scooters, and crutches. °· Turn on lights if it is dark. Replace any light bulbs that burn out. °· Set up furniture so that there are clear paths. Keep the furniture in the same spot. °· Fix any uneven floor surfaces. °· Choose a carpet design that does not hide the edge of steps of a stairway. °· Be aware of any and all pets. °· Review your medicines with your healthcare provider. Some medicines can cause dizziness or changes in blood pressure, which increase your risk of falling. °Talk  with your health care provider about other ways that you can decrease your risk of falls. This may include working with a physical therapist or trainer to improve your strength, balance, and endurance. °  °This information is not intended to replace advice given to you by your health care provider. Make sure you discuss any questions you have with your health care provider. °  °Document Released: 11/03/2002 Document Revised: 03/30/2015 Document Reviewed: 12/18/2014 °[employer] Interactive Patient Education ©2016 [employer] ° ° °Contusion °A contusion is a deep bruise. Contusions are the result of a blunt injury to tissues and muscle fibers under the skin. The injury causes bleeding under the skin. The skin overlying the contusion may turn blue, purple, or yellow. Minor injuries will give you a painless contusion, but more severe contusions may stay painful and swollen for a few weeks.  °CAUSES  °This condition is usually caused by a blow, trauma, or direct force to an area of the body. °SYMPTOMS  °Symptoms of this condition include: °· Swelling of the injured area. °· Pain and tenderness in the injured area. °· Discoloration. The area may have redness and then turn blue, purple, or yellow. °DIAGNOSIS  °This condition is diagnosed based on a physical exam and medical history. An X-ray, CT scan, or MRI may be needed to determine if there are any associated injuries, such as broken bones (fractures). °TREATMENT  °Specific treatment for this condition depends on what area of the body was injured. In general, the best treatment for a contusion is resting, icing, applying pressure to (compression), and elevating the injured area. This is often called the RICE strategy. Over-the-counter anti-inflammatory medicines may also be recommended for pain control.  °HOME CARE INSTRUCTIONS  °· Rest the injured area. °· If directed, apply ice to the injured area: °¨ Put ice in a plastic bag. °¨ Place a towel between your skin and  the bag. °¨ Leave the ice on for 20 minutes, 2-3 times per day. °· If directed, apply light compression to the injured area using an elastic bandage. Make sure the bandage is not wrapped too tightly. Remove and reapply the bandage as directed by your health care provider. °· If possible, raise (elevate) the injured area above the level of your heart while you are sitting or lying down. °· Take over-the-counter and prescription medicines only as told by your health care provider. °SEEK MEDICAL CARE IF: °· Your symptoms do not improve after several days of treatment. °· Your symptoms get worse. °· You have difficulty moving the injured area. °SEEK IMMEDIATE MEDICAL CARE IF:  °· You have severe pain. °· You have numbness in a hand or foot. °· Your hand or foot turns   pale or cold. °  °This information is not intended to replace advice given to you by your health care provider. Make sure you discuss any questions you have with your health care provider. °  °Document Released: 08/23/2005 Document Revised: 08/04/2015 Document Reviewed: 03/31/2015 °[employer] Interactive Patient Education ©2016 [employer] ° °

## 2015-09-24 ENCOUNTER — Inpatient Hospital Stay: Payer: Self-pay | Admitting: Internal Medicine

## 2018-04-27 DEATH — deceased
